# Patient Record
Sex: Female | Born: 1970 | Race: Black or African American | Hispanic: No | Marital: Single | Smoking: Current every day smoker
Health system: Southern US, Community
[De-identification: ages and names within clinical notes are randomized; demographics above are authoritative.]

## PROBLEM LIST (undated history)

## (undated) DIAGNOSIS — I10 Essential (primary) hypertension: Secondary | ICD-10-CM

---

## 2006-07-26 ENCOUNTER — Emergency Department: Payer: Self-pay | Admitting: Emergency Medicine

## 2006-07-26 ENCOUNTER — Other Ambulatory Visit: Payer: Self-pay

## 2006-10-10 ENCOUNTER — Emergency Department: Payer: Self-pay | Admitting: Emergency Medicine

## 2006-12-21 ENCOUNTER — Emergency Department: Payer: Self-pay | Admitting: Internal Medicine

## 2007-04-05 ENCOUNTER — Other Ambulatory Visit: Payer: Self-pay

## 2007-04-05 ENCOUNTER — Emergency Department: Payer: Self-pay | Admitting: Emergency Medicine

## 2007-05-18 ENCOUNTER — Emergency Department: Payer: Self-pay | Admitting: Emergency Medicine

## 2008-09-27 ENCOUNTER — Emergency Department: Payer: Self-pay | Admitting: Emergency Medicine

## 2008-10-22 ENCOUNTER — Emergency Department: Payer: Self-pay | Admitting: Unknown Physician Specialty

## 2008-11-04 ENCOUNTER — Emergency Department: Payer: Self-pay | Admitting: Emergency Medicine

## 2009-09-18 ENCOUNTER — Emergency Department: Payer: Self-pay | Admitting: Emergency Medicine

## 2010-01-11 ENCOUNTER — Emergency Department: Payer: Self-pay | Admitting: Emergency Medicine

## 2010-03-01 ENCOUNTER — Emergency Department: Payer: Self-pay | Admitting: Emergency Medicine

## 2010-07-27 ENCOUNTER — Emergency Department: Payer: Self-pay | Admitting: Emergency Medicine

## 2011-01-16 ENCOUNTER — Emergency Department: Payer: Self-pay | Admitting: Internal Medicine

## 2011-05-31 ENCOUNTER — Emergency Department: Payer: Self-pay | Admitting: Emergency Medicine

## 2011-08-22 ENCOUNTER — Emergency Department: Payer: Self-pay | Admitting: *Deleted

## 2011-12-27 ENCOUNTER — Ambulatory Visit: Payer: Self-pay | Admitting: Family Medicine

## 2012-03-06 ENCOUNTER — Emergency Department: Payer: Self-pay | Admitting: Emergency Medicine

## 2012-03-06 LAB — CBC
HGB: 10.8 g/dL — ABNORMAL LOW (ref 12.0–16.0)
MCH: 23.6 pg — ABNORMAL LOW (ref 26.0–34.0)
MCHC: 31.6 g/dL — ABNORMAL LOW (ref 32.0–36.0)
MCV: 75 fL — ABNORMAL LOW (ref 80–100)
Platelet: 348 10*3/uL (ref 150–440)
RBC: 4.58 10*6/uL (ref 3.80–5.20)
RDW: 17.7 % — ABNORMAL HIGH (ref 11.5–14.5)
WBC: 9.2 10*3/uL (ref 3.6–11.0)

## 2012-03-06 LAB — COMPREHENSIVE METABOLIC PANEL
Albumin: 3.8 g/dL (ref 3.4–5.0)
Anion Gap: 9 (ref 7–16)
Bilirubin,Total: 0.3 mg/dL (ref 0.2–1.0)
Chloride: 105 mmol/L (ref 98–107)
Co2: 25 mmol/L (ref 21–32)
EGFR (African American): >60
SGPT (ALT): 21 U/L (ref 12–78)
Sodium: 139 mmol/L (ref 136–145)
Total Protein: 7.8 g/dL (ref 6.4–8.2)

## 2012-03-06 LAB — URINALYSIS, COMPLETE
Bacteria: NONE SEEN
Nitrite: NEGATIVE
Protein: 30
Specific Gravity: 1.019 (ref 1.003–1.030)
Squamous Epithelial: 5

## 2013-01-09 ENCOUNTER — Emergency Department: Payer: Self-pay | Admitting: Emergency Medicine

## 2013-01-09 LAB — BASIC METABOLIC PANEL
BUN: 11 mg/dL (ref 7–18)
Chloride: 105 mmol/L (ref 98–107)
Creatinine: 0.77 mg/dL (ref 0.60–1.30)
EGFR (Non-African Amer.): >60
Glucose: 116 mg/dL — ABNORMAL HIGH (ref 65–99)
Osmolality: 272 (ref 275–301)
Potassium: 3.7 mmol/L (ref 3.5–5.1)
Sodium: 136 mmol/L (ref 136–145)

## 2013-01-09 LAB — CBC
HCT: 33.5 % — ABNORMAL LOW (ref 35.0–47.0)
MCH: 23.8 pg — ABNORMAL LOW (ref 26.0–34.0)
MCHC: 32.2 g/dL (ref 32.0–36.0)
MCV: 74 fL — ABNORMAL LOW (ref 80–100)
Platelet: 292 10*3/uL (ref 150–440)
RBC: 4.54 10*6/uL (ref 3.80–5.20)
RDW: 17.5 % — ABNORMAL HIGH (ref 11.5–14.5)

## 2013-05-17 ENCOUNTER — Ambulatory Visit: Payer: Self-pay | Admitting: Family Medicine

## 2014-02-18 ENCOUNTER — Emergency Department: Payer: Self-pay | Admitting: Emergency Medicine

## 2014-03-31 ENCOUNTER — Emergency Department: Payer: Self-pay | Admitting: Emergency Medicine

## 2014-07-03 ENCOUNTER — Other Ambulatory Visit: Payer: Self-pay | Admitting: Nurse Practitioner

## 2014-07-03 DIAGNOSIS — Z1239 Encounter for other screening for malignant neoplasm of breast: Secondary | ICD-10-CM

## 2014-07-25 ENCOUNTER — Ambulatory Visit: Payer: Self-pay | Attending: Nurse Practitioner

## 2014-12-09 ENCOUNTER — Ambulatory Visit: Payer: Self-pay | Admitting: Family Medicine

## 2015-03-02 ENCOUNTER — Encounter: Payer: Self-pay | Admitting: *Deleted

## 2015-03-02 ENCOUNTER — Emergency Department
Admission: EM | Admit: 2015-03-02 | Discharge: 2015-03-02 | Disposition: A | Discharge status: Home / Self Care | Payer: BLUE CROSS/BLUE SHIELD | Attending: Emergency Medicine | Admitting: Emergency Medicine

## 2015-03-02 ENCOUNTER — Emergency Department: Payer: BLUE CROSS/BLUE SHIELD

## 2015-03-02 DIAGNOSIS — R531 Weakness: Secondary | ICD-10-CM | POA: Diagnosis not present

## 2015-03-02 DIAGNOSIS — R42 Dizziness and giddiness: Secondary | ICD-10-CM | POA: Diagnosis present

## 2015-03-02 DIAGNOSIS — Z3202 Encounter for pregnancy test, result negative: Secondary | ICD-10-CM | POA: Diagnosis not present

## 2015-03-02 DIAGNOSIS — R5383 Other fatigue: Secondary | ICD-10-CM | POA: Diagnosis not present

## 2015-03-02 LAB — COMPREHENSIVE METABOLIC PANEL
ALBUMIN: 3.9 g/dL (ref 3.5–5.0)
ALK PHOS: 61 U/L (ref 38–126)
ALT: 14 U/L (ref 14–54)
ANION GAP: 7 (ref 5–15)
AST: 19 U/L (ref 15–41)
BILIRUBIN TOTAL: 0.7 mg/dL (ref 0.3–1.2)
BUN: 7 mg/dL (ref 6–20)
CALCIUM: 9 mg/dL (ref 8.9–10.3)
CO2: 25 mmol/L (ref 22–32)
Chloride: 105 mmol/L (ref 101–111)
Creatinine, Ser: 0.54 mg/dL (ref 0.44–1.00)
GFR calc Af Amer: >60 mL/min (ref >60)
GFR calc non Af Amer: >60 mL/min (ref >60)
GLUCOSE: 103 mg/dL — AB (ref 65–99)
POTASSIUM: 3.5 mmol/L (ref 3.5–5.1)
Sodium: 137 mmol/L (ref 135–145)
Total Protein: 7.3 g/dL (ref 6.5–8.1)

## 2015-03-02 LAB — CBC
HEMATOCRIT: 39 % (ref 35.0–47.0)
Hemoglobin: 12.5 g/dL (ref 12.0–16.0)
MCH: 24.9 pg — ABNORMAL LOW (ref 26.0–34.0)
MCHC: 32.2 g/dL (ref 32.0–36.0)
MCV: 77.4 fL — AB (ref 80.0–100.0)
Platelets: 277 10*3/uL (ref 150–440)
RBC: 5.04 MIL/uL (ref 3.80–5.20)
RDW: 17.3 % — ABNORMAL HIGH (ref 11.5–14.5)
WBC: 8.3 10*3/uL (ref 3.6–11.0)

## 2015-03-02 LAB — PREGNANCY, URINE: Preg Test, Ur: NEGATIVE

## 2015-03-02 LAB — TROPONIN I: Troponin I: <0.03 ng/mL (ref <0.031)

## 2015-03-02 MED ORDER — MECLIZINE HCL 25 MG PO TABS: 25.0000 mg | ORAL_TABLET | Freq: Three times a day (TID) | ORAL | Status: DC | PRN

## 2015-03-02 MED ORDER — MECLIZINE HCL 25 MG PO TABS
25.0000 mg | ORAL_TABLET | Freq: Once | ORAL | Status: AC
  Administered 2015-03-02: 25 mg via ORAL
  Filled 2015-03-02: qty 1

## 2015-03-02 MED ORDER — SODIUM CHLORIDE 0.9 % IV BOLUS (SEPSIS)
1000.0000 mL | Freq: Once | INTRAVENOUS | Status: AC
  Administered 2015-03-02: 1000 mL via INTRAVENOUS

## 2015-03-02 NOTE — ED Provider Notes (Signed)
[facility] Emergency Department Provider Note  Time seen: 2:18 PM  I have reviewed the triage vital signs and the nursing notes.   HISTORY  Chief Complaint No chief complaint on file.    HPI Whitney Hart is a 45 y.o. female presents to the emergency department with acute onset of slurred speech, and generalized weakness at 1:15 PM. According to the patient she was at work when a precisely 1:15 PM she became dizzy, states she was having trouble speaking. She is not sure if she was slurring her words her just because of the extreme fatigue became over her that she was not able to speak correctly. She states she attempted to get up and walk but kept falling to her right side. States her symptoms have resolved at this time, feels extremely fatigued, but denies any slurred speech, denies any right sided weakness. Denies any similar symptoms previously. Patient states she has had a mild cough for the past one week, and she describes a sinus infection which is now resolved. Denies any chest pain or headache now or at any time.     No past medical history on file.  There are no active problems to display for this patient.   No past surgical history on file.  No current outpatient prescriptions on file.  Allergies Review of patient's allergies indicates not on file.  No family history on file.  Social History Social History  Substance Use Topics  . Smoking status: Not on file  . Smokeless tobacco: Not on file  . Alcohol Use: Not on file    Review of Systems Constitutional: Negative for fever. Cardiovascular: Negative for chest pain. Respiratory: Negative for shortness of breath. Gastrointestinal: Negative for abdominal pain Neurological: Negative for . Possible right-sided weakness which is now resolved. Denies numbness. 10-point ROS otherwise negative.  ____________________________________________   PHYSICAL EXAM:  VITAL  SIGNS:  Constitutional: Alert and oriented. Well appearing and in no distress. Eyes: Normal exam, [facility]   Head: Normocephalic and atraumatic.   Mouth/Throat: Mucous membranes are moist. Cardiovascular: Normal rate, regular rhythm. No murmur Respiratory: Normal respiratory effort without tachypnea nor retractions. Breath sounds are clear  Gastrointestinal: Soft and nontender. No distention.   Musculoskeletal: Nontender with normal range of motion in all extremities Neurologic:  Normal speech and language. No gross focal neurologic deficits. Equal grip strengths bilaterally. 5/5 motor in all extremities. No pronator drift. No lower extremity drift. Cranial nerves intact. No facial droop. Skin:  Skin is warm, dry and intact.  Psychiatric: Mood and affect are normal. Speech and behavior are normal.   ____________________________________________    EKG  EKG reviewed and interpreted by myself shows normal sinus rhythm at 92 bpm, narrow QRS, normal axis, normal intervals, nonspecific ST changes. No ST elevations.  ____________________________________________    RADIOLOGY  CT shows no acute abnormalities  ____________________________________________   INITIAL IMPRESSION / ASSESSMENT AND PLAN / ED COURSE  Pertinent labs & imaging results that were available during my care of the patient were reviewed by me and considered in my medical decision making (see chart for details).  Patient presents the emergency department after an episode of extreme fatigue, difficulty speaking, and possible right-sided weakness. States her symptoms are largely resolved besides extreme fatigue. Denies any similar symptoms in the past. Symptoms started promptly at 1:15 PM. States her dizziness has resolved. Denies headache now or at any time. We will check labs, CT head, EKG, and consult telemetry neurology. Patient agreeable to  plan.  CT, EKG, labs are largely within normal limits. Patient has been  seen by telemetry neurology, they do not believe the patient's symptoms are consistent with CVA. With a normal workup, they recommend follow-up with her primary care physician. Patient has no symptoms at present time, we will dose meclizine for some mild dizziness, dose IV fluids, and monitor closely in the emergency department but we'll likely discharge home if patient remains asymptomatic.  Telemetry neurology does not believe symptoms are consistent with CVA. Labs are within normal limits. CT negative. The patient appears very well the emergency department, ambulates without difficulty, states all of her symptoms are gone. We'll discharge home with primary care follow-up tomorrow. Patient agreeable plan.  ____________________________________________   FINAL CLINICAL IMPRESSION(S) / ED DIAGNOSES  Dizziness Fatigue   Harvest Dark, MD 03/02/15 1659

## 2015-03-02 NOTE — ED Notes (Signed)
Patient taken to CT scan.

## 2015-03-02 NOTE — Discharge Instructions (Signed)
Dizziness Dizziness is a common problem. It makes you feel unsteady or lightheaded. You may feel like you are about to pass out (faint). Dizziness can lead to injury if you stumble or fall. Anyone can get dizzy, but dizziness is more common in older adults. This condition can be caused by a number of things, including:  Medicines.  Dehydration.  Illness. HOME CARE Following these instructions may help with your condition: Eating and Drinking  Drink enough fluid to keep your pee (urine) clear or pale yellow. This helps to keep you from getting dehydrated. Try to drink more clear fluids, such as water.  Do not drink alcohol.  Limit how much caffeine you drink or eat if told by your doctor.  Limit how much salt you drink or eat if told by your doctor. Activity  Avoid making quick movements.  When you stand up from sitting in a chair, steady yourself until you feel okay.  In the morning, first sit up on the side of the bed. When you feel okay, stand slowly while you hold onto something. Do this until you know that your balance is fine.  Move your legs often if you need to stand in one place for a long time. Tighten and relax your muscles in your legs while you are standing.  Do not drive or use heavy machinery if you feel dizzy.  Avoid bending down if you feel dizzy. Place items in your home so that they are easy for you to reach without leaning over. Lifestyle  Do not use any tobacco products, including cigarettes, chewing tobacco, or electronic cigarettes. If you need help quitting, ask your doctor.  Try to lower your stress level, such as with yoga or meditation. Talk with your doctor if you need help. General Instructions  Watch your dizziness for any changes.  Take medicines only as told by your doctor. Talk with your doctor if you think that your dizziness is caused by a medicine that you are taking.  Tell a friend or a family member that you are feeling dizzy. If he or  she notices any changes in your behavior, have this person call your doctor.  Keep all follow-up visits as told by your doctor. This is important. GET HELP IF:  Your dizziness does not go away.  Your dizziness or light-headedness gets worse.  You feel sick to your stomach (nauseous).  You have trouble hearing.  You have new symptoms.  You are unsteady on your feet or you feel like the room is spinning. GET HELP RIGHT AWAY IF:  You throw up (vomit) or have diarrhea and are unable to eat or drink anything.  You have trouble:  Talking.  Walking.  Swallowing.  Using your arms, hands, or legs.  You feel generally weak.  You are not thinking clearly or you have trouble forming sentences. It may take a friend or family member to notice this.  You have:  Chest pain.  Pain in your belly (abdomen).  Shortness of breath.  Sweating.  Your vision changes.  You are bleeding.  You have a headache.  You have neck pain or a stiff neck.  You have a fever.   This information is not intended to replace advice given to you by your health care provider. Make sure you discuss any questions you have with your health care provider.   Document Released: 01/14/2011 Document Revised: 06/11/2014 Document Reviewed: 01/21/2014 Elsevier Interactive Patient Education 2016 Elsevier Inc.  Weakness Weakness is a  lack of strength. You may feel weak all over your body or just in one part of your body. Weakness can be serious. In some cases, you may need more medical tests. HOME [place] a well-balanced diet.  Try to exercise every day.  Only take medicines as told by your doctor. GET HELP RIGHT AWAY IF:   You cannot do your normal daily activities.  You cannot walk up and down stairs, or you feel very tired when you do so.  You have shortness of breath or chest pain.  You have trouble moving parts of your body.  You have weakness in only one body part or on only one  side of the body.  You have a fever.  You have trouble speaking or swallowing.  You cannot control when you pee (urinate) or poop (bowel movement).  You have black or bloody throw up (vomit) or poop.  Your weakness gets worse or spreads to other body parts.  You have new aches or pains. MAKE SURE YOU:   Understand these instructions.  Will watch your condition.  Will get help right away if you are not doing well or get worse.   This information is not intended to replace advice given to you by your health care provider. Make sure you discuss any questions you have with your health care provider.   Document Released: 01/08/2008 Document Revised: 07/27/2011 Document Reviewed: 03/26/2011 Elsevier Interactive Patient Education [employer].

## 2015-03-02 NOTE — ED Notes (Signed)
Patient states that approximately 1/2 hour after arrival to work, she felt dizzy, weak and slurred speech and blurry vision that occurred for  5 minutes. Patient states she had 2 episodes of vomiting, one last night and one this morning. Patient states she became short of breath and weak with vomiting.

## 2015-08-12 ENCOUNTER — Emergency Department
Admission: EM | Admit: 2015-08-12 | Discharge: 2015-08-13 | Disposition: A | Discharge status: Home / Self Care | Payer: BLUE CROSS/BLUE SHIELD | Attending: Emergency Medicine | Admitting: Emergency Medicine

## 2015-08-12 DIAGNOSIS — F17200 Nicotine dependence, unspecified, uncomplicated: Secondary | ICD-10-CM | POA: Diagnosis not present

## 2015-08-12 DIAGNOSIS — R109 Unspecified abdominal pain: Secondary | ICD-10-CM

## 2015-08-12 DIAGNOSIS — D259 Leiomyoma of uterus, unspecified: Secondary | ICD-10-CM

## 2015-08-12 DIAGNOSIS — M546 Pain in thoracic spine: Secondary | ICD-10-CM | POA: Diagnosis present

## 2015-08-12 DIAGNOSIS — Z79899 Other long term (current) drug therapy: Secondary | ICD-10-CM | POA: Insufficient documentation

## 2015-08-12 DIAGNOSIS — R1013 Epigastric pain: Secondary | ICD-10-CM | POA: Diagnosis not present

## 2015-08-12 NOTE — ED Notes (Signed)
Pt reports she is having pain under left shoulder blade that radiates under left breast (when the pain "hits hard" her head hurts and she becomes short of breath) - Denies any other problems at this time - Pt reports she has been having this issue for 3 days of and on

## 2015-08-12 NOTE — ED Notes (Signed)
Pt in with co mid left back pain that radiates under left breast, denies any injury. States pain worse when she moves and takes a deep breath.

## 2015-08-13 ENCOUNTER — Emergency Department: Payer: BLUE CROSS/BLUE SHIELD

## 2015-08-13 LAB — URINALYSIS COMPLETE WITH MICROSCOPIC (ARMC ONLY)
BACTERIA UA: NONE SEEN
Bilirubin Urine: NEGATIVE
GLUCOSE, UA: NEGATIVE mg/dL
Ketones, ur: NEGATIVE mg/dL
Leukocytes, UA: NEGATIVE
NITRITE: NEGATIVE
Protein, ur: NEGATIVE mg/dL
SPECIFIC GRAVITY, URINE: 1.008 (ref 1.005–1.030)
pH: 6 (ref 5.0–8.0)

## 2015-08-13 MED ORDER — OXYCODONE-ACETAMINOPHEN 5-325 MG PO TABS
ORAL_TABLET | ORAL | Status: AC
  Administered 2015-08-13: 1 via ORAL
  Filled 2015-08-13: qty 1

## 2015-08-13 MED ORDER — OXYCODONE-ACETAMINOPHEN 5-325 MG PO TABS
1.0000 | ORAL_TABLET | Freq: Once | ORAL | Status: AC
  Administered 2015-08-13: 1 via ORAL

## 2015-08-13 MED ORDER — CYCLOBENZAPRINE HCL 10 MG PO TABS: 10.0000 mg | ORAL_TABLET | Freq: Three times a day (TID) | ORAL | Status: DC | PRN

## 2015-08-13 NOTE — Discharge Instructions (Signed)
Abdominal Pain, Adult Many things can cause abdominal pain. Usually, abdominal pain is not caused by a disease and will improve without treatment. It can often be observed and treated at home. Your health care provider will do a physical exam and possibly order blood tests and X-rays to help determine the seriousness of your pain. However, in many cases, more time must pass before a clear cause of the pain can be found. Before that point, your health care provider may not know if you need more testing or further treatment. HOME CARE INSTRUCTIONS Monitor your abdominal pain for any changes. The following actions may help to alleviate any discomfort you are experiencing:  Only take over-the-counter or prescription medicines as directed by your health care provider.  Do not take laxatives unless directed to do so by your health care provider.  Try a clear liquid diet (broth, tea, or water) as directed by your health care provider. Slowly move to a bland diet as tolerated. SEEK MEDICAL CARE IF:  You have unexplained abdominal pain.  You have abdominal pain associated with nausea or diarrhea.  You have pain when you urinate or have a bowel movement.  You experience abdominal pain that wakes you in the night.  You have abdominal pain that is worsened or improved by eating food.  You have abdominal pain that is worsened with eating fatty foods.  You have a fever. SEEK IMMEDIATE MEDICAL CARE IF:  Your pain does not go away within 2 hours.  You keep throwing up (vomiting).  Your pain is felt only in portions of the abdomen, such as the right side or the left lower portion of the abdomen.  You pass bloody or black tarry stools. MAKE SURE YOU:  Understand these instructions.  Will watch your condition.  Will get help right away if you are not doing well or get worse.   This information is not intended to replace advice given to you by your health care provider. Make sure you discuss  any questions you have with your health care provider.   Document Released: 11/04/2004 Document Revised: 10/16/2014 Document Reviewed: 10/04/2012 Elsevier Interactive Patient Education 2016 Elsevier Inc.  Uterine Fibroids Uterine fibroids are tissue masses (tumors) that can develop in the womb (uterus). They are also called leiomyomas. This type of tumor is not cancerous (benign) and does not spread to other parts of the body outside of the pelvic area, which is between the hip bones. Occasionally, fibroids may develop in the fallopian tubes, in the cervix, or on the support structures (ligaments) that surround the uterus. You can have one or many fibroids. Fibroids can vary in size, weight, and where they grow in the uterus. Some can become quite large. Most fibroids do not require medical treatment. CAUSES A fibroid can develop when a single uterine cell keeps growing (replicating). Most cells in the human body have a control mechanism that keeps them from replicating without control. SIGNS AND SYMPTOMS Symptoms may include:   Heavy bleeding during your period.  Bleeding or spotting between periods.  Pelvic pain and pressure.  Bladder problems, such as needing to urinate more often (urinary frequency) or urgently.  Inability to reproduce offspring (infertility).  Miscarriages. DIAGNOSIS Uterine fibroids are diagnosed through a physical exam. Your health care provider may feel the lumpy tumors during a pelvic exam. Ultrasonography and an MRI may be done to determine the size, location, and number of fibroids. TREATMENT Treatment may include:  Watchful waiting. This involves getting the fibroid  checked by your health care provider to see if it grows or shrinks. Follow your health care provider's recommendations for how often to have this checked.  Hormone medicines. These can be taken by mouth or given through an intrauterine device (IUD).  Surgery.  Removing the fibroids  (myomectomy) or the uterus (hysterectomy).  Removing blood supply to the fibroids (uterine artery embolization). If fibroids interfere with your fertility and you want to become pregnant, your health care provider may recommend having the fibroids removed.  HOME CARE INSTRUCTIONS  Keep all follow-up visits as directed by your health care provider. This is important.  Take medicines only as directed by your health care provider.  If you were prescribed a hormone treatment, take the hormone medicines exactly as directed.  Do not take aspirin, because it can cause bleeding.  Ask your health care provider about taking iron pills and increasing the amount of dark green, leafy vegetables in your diet. These actions can help to boost your blood iron levels, which may be affected by heavy menstrual bleeding.  Pay close attention to your period and tell your health care provider about any changes, such as:  Increased blood flow that requires you to use more pads or tampons than usual per month.  A change in the number of days that your period lasts per month.  A change in symptoms that are associated with your period, such as abdominal cramping or back pain. SEEK MEDICAL CARE IF:  You have pelvic pain, back pain, or abdominal cramps that cannot be controlled with medicines.  You have an increase in bleeding between and during periods.  You soak tampons or pads in a half hour or less.  You feel lightheaded, extra tired, or weak. SEEK IMMEDIATE MEDICAL CARE IF:  You faint.  You have a sudden increase in pelvic pain.   This information is not intended to replace advice given to you by your health care provider. Make sure you discuss any questions you have with your health care provider.   Document Released: 01/23/2000 Document Revised: 02/15/2014 Document Reviewed: 07/24/2013 Elsevier Interactive Patient Education [employer].

## 2015-08-15 NOTE — ED Provider Notes (Signed)
[facility] Emergency Department Provider Note  ____________________________________________  Time seen: 12:10 AM  I have reviewed the triage vital signs and the nursing notes.   HISTORY  Chief Complaint Back Pain     HPI Whitney Hart is a 45 y.o. female presents with left upper back pain with radiation under (3 days intermittently. Patient states the pain is worse with movement and deep inspiration. Patient denies any dysuria or hematuria. Patient denies any increased urinary frequency or urgency.      Past medical history None There are no active problems to display for this patient.   Past surgical history None  Current Outpatient Rx  Name  Route  Sig  Dispense  Refill  . cyclobenzaprine (FLEXERIL) 10 MG tablet   Oral   Take 1 tablet (10 mg total) by mouth 3 (three) times daily as needed for muscle spasms.   30 tablet   0   . lisinopril (PRINIVIL,ZESTRIL) 10 MG tablet   Oral   Take 20 mg by mouth daily.      0   . meclizine (ANTIVERT) 25 MG tablet   Oral   Take 1 tablet (25 mg total) by mouth 3 (three) times daily as needed for dizziness.   30 tablet   0   . Potassium Chloride ER 20 MEQ TBCR   Oral   Take 20 mEq by mouth daily.      0   . PROAIR HFA 108 (90 Base) MCG/ACT inhaler   Inhalation   Inhale 2 puffs into the lungs every 4 (four) hours as needed.      0     Dispense as written.     Allergies No known drug allergies No family history on file.  Social History Social History  Substance Use Topics  . Smoking status: Current Every Day Smoker  . Smokeless tobacco: Not on file  . Alcohol Use: Yes     Comment: occasionally    Review of Systems  Constitutional: Negative for fever. Eyes: Negative for visual changes. ENT: Negative for sore throat. Cardiovascular: Negative for chest pain. Respiratory: Negative for shortness of breath. Gastrointestinal: Negative for abdominal pain, vomiting and diarrhea.  Positive for left flank pain Genitourinary: Negative for dysuria. Musculoskeletal: Positive for back pain. Skin: Negative for rash. Neurological: Negative for headaches, focal weakness or numbness.   10-point ROS otherwise negative.  ____________________________________________   PHYSICAL EXAM:  VITAL SIGNS: ED Triage Vitals  Enc Vitals Group     BP 08/12/15 2300 125/76 mmHg     Pulse Rate 08/12/15 2300 95     Resp 08/12/15 2300 18     Temp 08/12/15 2300 98.2 F (36.8 C)     Temp Source 08/12/15 2300 Oral     SpO2 08/12/15 2300 96 %     Weight 08/12/15 2259 225 lb (102.059 kg)     Height 08/12/15 2259 5\' 6"  (1.676 m)     Head Cir --      Peak Flow --      Pain Score 08/12/15 2259 8     Pain Loc --      Pain Edu? --      Excl. in [place]? --      Constitutional: Alert and oriented. Well appearing and in no distress. Eyes: Conjunctivae are normal. PERRL. Normal extraocular movements. ENT   Head: Normocephalic and atraumatic.   Nose: No congestion/rhinnorhea.   Mouth/Throat: Mucous membranes are moist.   Neck: No stridor. Hematological/Lymphatic/Immunilogical: No  cervical lymphadenopathy. Cardiovascular: Normal rate, regular rhythm. Normal and symmetric distal pulses are present in all extremities. No murmurs, rubs, or gallops. Respiratory: Normal respiratory effort without tachypnea nor retractions. Breath sounds are clear and equal bilaterally. No wheezes/rales/rhonchi. Gastrointestinal: Pain with palpation epigastric left upper quadrant.. No distention. There is no CVA tenderness. Genitourinary: deferred Musculoskeletal: Nontender with normal range of motion in all extremities. No joint effusions.  No lower extremity tenderness nor edema. Neurologic:  Normal speech and language. No gross focal neurologic deficits are appreciated. Speech is normal.  Skin:  Skin is warm, dry and intact. No rash noted. Psychiatric: Mood and affect are normal. Speech and behavior  are normal. Patient exhibits appropriate insight and judgment.  ____________________________________________    LABS (pertinent positives/negatives)  Labs Reviewed  URINALYSIS COMPLETEWITH MICROSCOPIC ([place] ONLY) - Abnormal; Notable for the following:    Color, Urine STRAW (*)    APPearance CLEAR (*)    Hgb urine dipstick 2+ (*)    Squamous Epithelial / LPF 0-5 (*)    All other components within normal limits     ____________________________________________   EKG  ED ECG REPORT I, [place] N BROWN, the attending physician, personally viewed and interpreted this ECG.   Date: 08/15/2015  EKG Time: 11:28 PM  Rate: 87  Rhythm: Normal sinus rhythm  Axis: Normal  Intervals: Normal  ST&T Change: None   ____________________________________________    RADIOLOGY CT Renal Stone Study (Final result) Result time: 08/13/15 02:24:04   Final result by Rad Results In Interface (08/13/15 02:24:04)   Narrative:   CLINICAL DATA: Left flank pain. Intermittent pain for 3 days.  EXAM: CT ABDOMEN AND PELVIS WITHOUT CONTRAST  TECHNIQUE: Multidetector CT imaging of the abdomen and pelvis was performed following the standard protocol without IV contrast.  COMPARISON: Pelvic ultrasound 03/03/2012  FINDINGS: Lower chest: Bilateral lower lobe atelectasis. No pleural fluid.  Liver: No focal lesion allowing for lack contrast.  Hepatobiliary: Gallbladder partially distended, no calcified stone. No biliary dilatation.  Pancreas: No ductal dilatation or inflammation.  Spleen: Normal.  Adrenal glands: No nodule.  Kidneys: Symmetric in size without stones or hydronephrosis. There is no perinephric stranding. Both ureters are decompressed without stones along the course.  Stomach/Bowel: Stomach physiologically distended. There are no dilated or thickened small bowel loops. Small volume of stool throughout the colon without colonic wall thickening. The appendix is  normal.  Vascular/Lymphatic: No retroperitoneal adenopathy. Abdominal aorta is normal in caliber. Mild atherosclerosis of the abdominal aorta and its branches. No aneurysm.  Reproductive: Uterus is bulbous consistent with fibroids. Low-density structures in both adnexa, may reflect hydrosalpinx. No surrounding inflammation.  Bladder: Physiologically distended, no wall thickening. No bladder stone.  Other: No free air, free fluid, or intra-abdominal fluid collection.  Musculoskeletal: There are no acute or suspicious osseous abnormalities. Scattered degenerative change in the spine.  IMPRESSION: 1. No renal stones or obstructive uropathy. 2. Low-density structures in both adnexa, may reflect hydrosalpinx. In the absence of symptoms referable to the pelvis this is likely incidental. 3. Mild aortic atherosclerosis.   Electronically Signed By: Jeb Levering M.D. On: 08/13/2015 02:24       Procedures      INITIAL IMPRESSION / ASSESSMENT AND PLAN / ED COURSE  Pertinent labs & imaging results that were available during my care of the patient were reviewed by me and considered in my medical decision making (see chart for details).    ____________________________________________   FINAL CLINICAL IMPRESSION(S) / ED DIAGNOSES  Final diagnoses:  Flank pain  Uterine leiomyoma, unspecified location      Gregor Hams, MD 08/15/15 [phone]

## 2016-02-10 ENCOUNTER — Other Ambulatory Visit: Payer: Self-pay | Admitting: Family Medicine

## 2016-02-10 DIAGNOSIS — N6452 Nipple discharge: Secondary | ICD-10-CM

## 2016-02-10 DIAGNOSIS — N6459 Other signs and symptoms in breast: Secondary | ICD-10-CM

## 2016-02-23 ENCOUNTER — Encounter (HOSPITAL_COMMUNITY): Payer: Self-pay

## 2016-02-23 ENCOUNTER — Ambulatory Visit
Admission: RE | Admit: 2016-02-23 | Discharge: 2016-02-23 | Disposition: A | Discharge status: Home / Self Care | Payer: BLUE CROSS/BLUE SHIELD | Source: Ambulatory Visit | Attending: Family Medicine | Admitting: Family Medicine

## 2016-02-23 DIAGNOSIS — N6452 Nipple discharge: Secondary | ICD-10-CM

## 2016-02-23 DIAGNOSIS — N6459 Other signs and symptoms in breast: Secondary | ICD-10-CM

## 2016-05-13 ENCOUNTER — Emergency Department
Admission: EM | Admit: 2016-05-13 | Discharge: 2016-05-13 | Disposition: A | Discharge status: Home / Self Care | Payer: BLUE CROSS/BLUE SHIELD | Attending: Emergency Medicine | Admitting: Emergency Medicine

## 2016-05-13 ENCOUNTER — Emergency Department: Payer: BLUE CROSS/BLUE SHIELD

## 2016-05-13 ENCOUNTER — Encounter: Payer: Self-pay | Admitting: Emergency Medicine

## 2016-05-13 DIAGNOSIS — Z79899 Other long term (current) drug therapy: Secondary | ICD-10-CM | POA: Diagnosis not present

## 2016-05-13 DIAGNOSIS — R079 Chest pain, unspecified: Secondary | ICD-10-CM | POA: Insufficient documentation

## 2016-05-13 DIAGNOSIS — I10 Essential (primary) hypertension: Secondary | ICD-10-CM | POA: Insufficient documentation

## 2016-05-13 DIAGNOSIS — F17210 Nicotine dependence, cigarettes, uncomplicated: Secondary | ICD-10-CM | POA: Diagnosis not present

## 2016-05-13 HISTORY — DX: Essential (primary) hypertension: I10

## 2016-05-13 LAB — BASIC METABOLIC PANEL
Anion gap: 7 (ref 5–15)
BUN: 14 mg/dL (ref 6–20)
CHLORIDE: 103 mmol/L (ref 101–111)
CO2: 26 mmol/L (ref 22–32)
Calcium: 9.2 mg/dL (ref 8.9–10.3)
Creatinine, Ser: 0.78 mg/dL (ref 0.44–1.00)
GFR calc Af Amer: >60 mL/min (ref >60)
Glucose, Bld: 95 mg/dL (ref 65–99)
POTASSIUM: 3.8 mmol/L (ref 3.5–5.1)
Sodium: 136 mmol/L (ref 135–145)

## 2016-05-13 LAB — FIBRIN DERIVATIVES D-DIMER (ARMC ONLY): Fibrin derivatives D-dimer (ARMC): 300.35 (ref 0.00–499.00)

## 2016-05-13 LAB — CBC
HEMATOCRIT: 42.8 % (ref 35.0–47.0)
Hemoglobin: 14 g/dL (ref 12.0–16.0)
MCH: 27.3 pg (ref 26.0–34.0)
MCHC: 32.7 g/dL (ref 32.0–36.0)
MCV: 83.5 fL (ref 80.0–100.0)
Platelets: 259 10*3/uL (ref 150–440)
RBC: 5.12 MIL/uL (ref 3.80–5.20)
RDW: 15.9 % — AB (ref 11.5–14.5)
WBC: 9.9 10*3/uL (ref 3.6–11.0)

## 2016-05-13 LAB — TROPONIN I: Troponin I: <0.03 ng/mL (ref <0.03)

## 2016-05-13 MED ORDER — IBUPROFEN 800 MG PO TABS: 800.0000 mg | ORAL_TABLET | Freq: Three times a day (TID) | ORAL | 0 refills | Status: DC | PRN

## 2016-05-13 MED ORDER — OXYCODONE-ACETAMINOPHEN 5-325 MG PO TABS
2.0000 | ORAL_TABLET | Freq: Once | ORAL | Status: AC
  Administered 2016-05-13: 2 via ORAL
  Filled 2016-05-13: qty 2

## 2016-05-13 MED ORDER — DIAZEPAM 5 MG PO TABS: 5.0000 mg | ORAL_TABLET | Freq: Three times a day (TID) | ORAL | 0 refills | Status: DC | PRN

## 2016-05-13 NOTE — ED Triage Notes (Signed)
Patient presents to ED via POV with c/o CP x 3 days. Patient states, "It feels like someone is kicking me". Denies any cardiac history. Patient also c/o SOB. Able to speak full, complete sentences.

## 2016-05-13 NOTE — ED Provider Notes (Signed)
[facility] Emergency Department Provider Note       Time seen: ----------------------------------------- 6:22 PM on 05/13/2016 -----------------------------------------     I have reviewed the triage vital signs and the nursing notes.   HISTORY   Chief Complaint Chest Pain    HPI Whitney Hart is a 46 y.o. female who presents to the ED for chest pain for the last 3 days. Patient states it feels like someone is kicking her. Patient states started in the right side of the chest and notes made it more to the central part of the chest. She also complains of shortness of breath. Pain is 8 out of 10, nothing makes it better or worse.   Past Medical History:  Diagnosis Date  . Hypertension     There are no active problems to display for this patient.   History reviewed. No pertinent surgical history.  Allergies Patient has no known allergies.  Social History Social History  Substance Use Topics  . Smoking status: Current Every Day Smoker    Packs/day: 0.50    Types: Cigarettes  . Smokeless tobacco: Never Used  . Alcohol use Yes     Comment: occasionally    Review of Systems Constitutional: Negative for fever. Cardiovascular: Positive for chest pain Respiratory: Positive for shortness of breath Gastrointestinal: Negative for abdominal pain, vomiting and diarrhea. Genitourinary: Negative for dysuria. Musculoskeletal: Negative for back pain. Skin: Negative for rash. Neurological: Negative for headaches, focal weakness or numbness.  10-point ROS otherwise negative.  ____________________________________________   PHYSICAL EXAM:  VITAL SIGNS: ED Triage Vitals  Enc Vitals Group     BP 05/13/16 1702 (!) 141/95     Pulse Rate 05/13/16 1702 70     Resp 05/13/16 1702 16     Temp 05/13/16 1702 98.3 F (36.8 C)     Temp Source 05/13/16 1702 Oral     SpO2 05/13/16 1702 99 %     Weight 05/13/16 1659 217 lb (98.4 kg)     Height  05/13/16 1659 5\' 6"  (1.676 m)     Head Circumference --      Peak Flow --      Pain Score 05/13/16 1659 10     Pain Loc --      Pain Edu? --      Excl. in [place]? --     Constitutional: Alert and oriented. Well appearing and in no distress. Eyes: Conjunctivae are normal. PERRL. Normal extraocular movements. ENT   Head: Normocephalic and atraumatic.   Nose: No congestion/rhinnorhea.   Mouth/Throat: Mucous membranes are moist.   Neck: No stridor. Cardiovascular: Normal rate, regular rhythm. No murmurs, rubs, or gallops. Respiratory: Normal respiratory effort without tachypnea nor retractions. Breath sounds are clear and equal bilaterally. No wheezes/rales/rhonchi. Gastrointestinal: Soft and nontender. Normal bowel sounds Musculoskeletal: Nontender with normal range of motion in extremities. No lower extremity tenderness nor edema. Neurologic:  Normal speech and language. No gross focal neurologic deficits are appreciated.  Skin:  Skin is warm, dry and intact. No rash noted. Psychiatric: Mood and affect are normal. Speech and behavior are normal.  ____________________________________________  EKG: Interpreted by me. Sinus rhythm rate of 67 bpm, normal PR interval, normal QRS, normal QT.  ____________________________________________  ED COURSE:  Pertinent labs & imaging results that were available during my care of the patient were reviewed by me and considered in my medical decision making (see chart for details). Patient presents for chest pain, we will assess with labs and  imaging as indicated.   Procedures ____________________________________________   LABS (pertinent positives/negatives)  Labs Reviewed  CBC - Abnormal; Notable for the following:       Result Value   RDW 15.9 (*)    All other components within normal limits  BASIC METABOLIC PANEL  TROPONIN I  FIBRIN DERIVATIVES D-DIMER ([facility] ONLY)    RADIOLOGY  Chest x-ray is  normal  ____________________________________________  FINAL ASSESSMENT AND PLAN  Chest pain  Plan: Patient's labs and imaging were dictated above. Patient had presented for Chest pain which appears to be noncardiac. She is low risk for ACS. She'll be discharged with Motrin and Valium and encouraged to have close follow-up with her doctor.   Earleen Newport, MD   Note: This note was generated in part or whole with voice recognition software. Voice recognition is usually quite accurate but there are transcription errors that can and very often do occur. I apologize for any typographical errors that were not detected and corrected.     Earleen Newport, MD 05/13/16 [phone]

## 2016-06-26 ENCOUNTER — Observation Stay: Payer: BLUE CROSS/BLUE SHIELD

## 2016-06-26 ENCOUNTER — Observation Stay
Admission: EM | Admit: 2016-06-26 | Discharge: 2016-06-27 | Disposition: A | Discharge status: Home / Self Care | Payer: BLUE CROSS/BLUE SHIELD | Attending: Internal Medicine | Admitting: Internal Medicine

## 2016-06-26 ENCOUNTER — Encounter: Payer: Self-pay | Admitting: Emergency Medicine

## 2016-06-26 ENCOUNTER — Emergency Department: Payer: BLUE CROSS/BLUE SHIELD

## 2016-06-26 DIAGNOSIS — G459 Transient cerebral ischemic attack, unspecified: Principal | ICD-10-CM | POA: Insufficient documentation

## 2016-06-26 DIAGNOSIS — Z79899 Other long term (current) drug therapy: Secondary | ICD-10-CM | POA: Insufficient documentation

## 2016-06-26 DIAGNOSIS — H538 Other visual disturbances: Secondary | ICD-10-CM | POA: Insufficient documentation

## 2016-06-26 DIAGNOSIS — Z6834 Body mass index (BMI) 34.0-34.9, adult: Secondary | ICD-10-CM | POA: Insufficient documentation

## 2016-06-26 DIAGNOSIS — E669 Obesity, unspecified: Secondary | ICD-10-CM | POA: Insufficient documentation

## 2016-06-26 DIAGNOSIS — R531 Weakness: Secondary | ICD-10-CM | POA: Diagnosis present

## 2016-06-26 DIAGNOSIS — F17210 Nicotine dependence, cigarettes, uncomplicated: Secondary | ICD-10-CM | POA: Diagnosis not present

## 2016-06-26 DIAGNOSIS — I10 Essential (primary) hypertension: Secondary | ICD-10-CM | POA: Diagnosis not present

## 2016-06-26 DIAGNOSIS — R200 Anesthesia of skin: Secondary | ICD-10-CM | POA: Insufficient documentation

## 2016-06-26 LAB — CBC
HEMATOCRIT: 40 % (ref 35.0–47.0)
HEMOGLOBIN: 13.5 g/dL (ref 12.0–16.0)
MCH: 27.3 pg (ref 26.0–34.0)
MCHC: 33.7 g/dL (ref 32.0–36.0)
MCV: 80.8 fL (ref 80.0–100.0)
Platelets: 248 10*3/uL (ref 150–440)
RBC: 4.95 MIL/uL (ref 3.80–5.20)
RDW: 14.8 % — ABNORMAL HIGH (ref 11.5–14.5)
WBC: 9.7 10*3/uL (ref 3.6–11.0)

## 2016-06-26 LAB — DIFFERENTIAL
BASOS ABS: 0.1 10*3/uL (ref 0–0.1)
Basophils Relative: 2 %
Eosinophils Absolute: 0.2 10*3/uL (ref 0–0.7)
Eosinophils Relative: 2 %
LYMPHS ABS: 3.7 10*3/uL — AB (ref 1.0–3.6)
LYMPHS PCT: 38 %
MONOS PCT: 12 %
Monocytes Absolute: 1.2 10*3/uL — ABNORMAL HIGH (ref 0.2–0.9)
NEUTROS ABS: 4.5 10*3/uL (ref 1.4–6.5)
Neutrophils Relative %: 46 %

## 2016-06-26 LAB — COMPREHENSIVE METABOLIC PANEL
ALBUMIN: 3.9 g/dL (ref 3.5–5.0)
ALT: 23 U/L (ref 14–54)
AST: 34 U/L (ref 15–41)
Alkaline Phosphatase: 61 U/L (ref 38–126)
Anion gap: 10 (ref 5–15)
BUN: 16 mg/dL (ref 6–20)
CHLORIDE: 99 mmol/L — AB (ref 101–111)
CO2: 26 mmol/L (ref 22–32)
Calcium: 9.2 mg/dL (ref 8.9–10.3)
Creatinine, Ser: 0.74 mg/dL (ref 0.44–1.00)
GFR calc non Af Amer: >60 mL/min (ref >60)
Glucose, Bld: 103 mg/dL — ABNORMAL HIGH (ref 65–99)
Potassium: 3.6 mmol/L (ref 3.5–5.1)
SODIUM: 135 mmol/L (ref 135–145)
Total Bilirubin: 0.7 mg/dL (ref 0.3–1.2)
Total Protein: 7.4 g/dL (ref 6.5–8.1)

## 2016-06-26 LAB — PROTIME-INR
INR: 0.93
Prothrombin Time: 12.5 seconds (ref 11.4–15.2)

## 2016-06-26 LAB — APTT: APTT: 29 s (ref 24–36)

## 2016-06-26 LAB — GLUCOSE, CAPILLARY: Glucose-Capillary: 90 mg/dL (ref 65–99)

## 2016-06-26 LAB — TROPONIN I: Troponin I: <0.03 ng/mL (ref <0.03)

## 2016-06-26 MED ORDER — METOPROLOL SUCCINATE ER 50 MG PO TB24: 100.0000 mg | ORAL_TABLET | Freq: Every day | ORAL | Status: DC

## 2016-06-26 MED ORDER — ASPIRIN EC 325 MG PO TBEC: 325.0000 mg | DELAYED_RELEASE_TABLET | Freq: Every day | ORAL | Status: DC

## 2016-06-26 MED ORDER — ALBUTEROL SULFATE (2.5 MG/3ML) 0.083% IN NEBU: 3.0000 mL | INHALATION_SOLUTION | RESPIRATORY_TRACT | Status: DC | PRN

## 2016-06-26 MED ORDER — HYDROCHLOROTHIAZIDE 25 MG PO TABS: 25.0000 mg | ORAL_TABLET | Freq: Every day | ORAL | Status: DC

## 2016-06-26 MED ORDER — STROKE: EARLY STAGES OF RECOVERY BOOK
Freq: Once | Status: AC
  Administered 2016-06-26: 18:00:00

## 2016-06-26 MED ORDER — AMOXICILLIN 250 MG PO CAPS
750.0000 mg | ORAL_CAPSULE | Freq: Once | ORAL | Status: AC
  Administered 2016-06-26: 18:00:00 750 mg via ORAL
  Filled 2016-06-26: qty 3

## 2016-06-26 MED ORDER — AMLODIPINE BESYLATE 10 MG PO TABS: 10.0000 mg | ORAL_TABLET | Freq: Every day | ORAL | Status: DC

## 2016-06-26 MED ORDER — ACETAMINOPHEN 650 MG RE SUPP: 650.0000 mg | RECTAL | Status: DC | PRN

## 2016-06-26 MED ORDER — VITAMIN D (ERGOCALCIFEROL) 1.25 MG (50000 UNIT) PO CAPS: 50000.0000 [IU] | ORAL_CAPSULE | ORAL | Status: DC

## 2016-06-26 MED ORDER — SPIRONOLACTONE 25 MG PO TABS: 25.0000 mg | ORAL_TABLET | Freq: Every day | ORAL | Status: DC

## 2016-06-26 MED ORDER — TRAMADOL HCL 50 MG PO TABS: 50.0000 mg | ORAL_TABLET | Freq: Two times a day (BID) | ORAL | Status: DC | PRN

## 2016-06-26 MED ORDER — ASPIRIN 300 MG RE SUPP: 300.0000 mg | Freq: Every day | RECTAL | Status: DC

## 2016-06-26 MED ORDER — ASPIRIN 81 MG PO CHEW
324.0000 mg | CHEWABLE_TABLET | Freq: Once | ORAL | Status: AC
  Administered 2016-06-26: 324 mg via ORAL
  Filled 2016-06-26: qty 4

## 2016-06-26 MED ORDER — ASPIRIN 81 MG PO CHEW
162.0000 mg | CHEWABLE_TABLET | Freq: Once | ORAL | Status: AC
  Administered 2016-06-26: 162 mg via ORAL
  Filled 2016-06-26: qty 2

## 2016-06-26 MED ORDER — ACETAMINOPHEN 325 MG PO TABS: 650.0000 mg | ORAL_TABLET | ORAL | Status: DC | PRN

## 2016-06-26 MED ORDER — ACETAMINOPHEN 160 MG/5ML PO SOLN
650.0000 mg | ORAL | Status: DC | PRN
Start: 2016-06-26 — End: 2016-06-27

## 2016-06-26 MED ORDER — NICOTINE 14 MG/24HR TD PT24
14.0000 mg | MEDICATED_PATCH | Freq: Every day | TRANSDERMAL | Status: DC
  Filled 2016-06-26: qty 1

## 2016-06-26 MED ORDER — ENOXAPARIN SODIUM 40 MG/0.4ML ~~LOC~~ SOLN
40.0000 mg | SUBCUTANEOUS | Status: DC
  Administered 2016-06-26: 21:00:00 40 mg via SUBCUTANEOUS
  Filled 2016-06-26: qty 0.4

## 2016-06-26 NOTE — ED Provider Notes (Addendum)
[facility] Emergency Department Provider Note   ____________________________________________   First MD Initiated Contact with Patient 06/26/16 1334     (approximate)  I have reviewed the triage vital signs and the nursing notes.   HISTORY  Chief Complaint Code Stroke   HPI Whitney Hart is a 46 y.o. female  reports she was at work when her left arm and leg got numb and weak. She had trouble lifting the arm and noted legs and the arm or both numb and she could hardly feel them. Symptoms started at 1250 today. That's just a little less than an hour ago. Patient reports now she is not weak at all anymore and the numbness is confined to the left shoulder and the anterior shin of the leg. Things got much better. She's not had this before. She has a history of hypertension and takes to diuretics.   Past Medical History:  Diagnosis Date  . Hypertension     There are no active problems to display for this patient.   History reviewed. No pertinent surgical history.  Prior to Admission medications   Medication Sig Start Date End Date Taking? Authorizing Provider  amLODipine (NORVASC) 10 MG tablet Take 10 mg by mouth daily. 04/18/14  Yes [provider]  cyclobenzaprine (FLEXERIL) 10 MG tablet Take 1 tablet (10 mg total) by mouth 3 (three) times daily as needed for muscle spasms. Patient not taking: Reported on 06/26/2016 08/13/15   Gregor Hams, MD  diazepam (VALIUM) 5 MG tablet Take 1 tablet (5 mg total) by mouth every 8 (eight) hours as needed for muscle spasms. 05/13/16   Earleen Newport, MD  hydrochlorothiazide (HYDRODIURIL) 25 MG tablet Take 25 mg by mouth daily. 05/26/16   [provider]  ibuprofen (ADVIL,MOTRIN) 800 MG tablet Take 1 tablet (800 mg total) by mouth every 8 (eight) hours as needed. Patient not taking: Reported on 06/26/2016 05/13/16   Earleen Newport, MD  meclizine (ANTIVERT) 25 MG tablet Take 1 tablet (25 mg  total) by mouth 3 (three) times daily as needed for dizziness. 03/02/15   Harvest Dark, MD  metoprolol succinate (TOPROL-XL) 100 MG 24 hr tablet Take 100 mg by mouth daily. 06/02/16   [provider]  PROAIR HFA 108 (90 Base) MCG/ACT inhaler Inhale 2 puffs into the lungs every 4 (four) hours as needed. 06/18/15   [provider]  spironolactone (ALDACTONE) 25 MG tablet Take 25 mg by mouth daily. 05/26/16   [provider]  traMADol (ULTRAM) 50 MG tablet Take 50 mg by mouth 2 (two) times daily as needed. 06/02/16   [provider]  triamcinolone cream (KENALOG) 0.5 % Apply 1 application topically daily as needed. 06/11/16   [provider]    Allergies Patient has no known allergies.  Family History  Problem Relation Age of Onset  . Breast cancer Neg Hx     Social History Social History  Substance Use Topics  . Smoking status: Current Every Day Smoker    Packs/day: 0.50    Types: Cigarettes  . Smokeless tobacco: Never Used  . Alcohol use Yes     Comment: occasionally    Review of Systems  Constitutional: No fever/chills Eyes: No visual changes. ENT: No sore throat. Cardiovascular: Denies chest pain. Respiratory: Denies shortness of breath. Gastrointestinal: No abdominal pain.  No nausea, no vomiting.  No diarrhea.  No constipation. Genitourinary: Negative for dysuria. Musculoskeletal: Negative for back pain. Skin: Negative for rash.  Neurological: Negative for headaches   ____________________________________________   PHYSICAL EXAM:  VITAL SIGNS: ED Triage Vitals [06/26/16 1334]  Enc Vitals Group     BP      Pulse      Resp      Temp 97.5 F (36.4 C)     Temp Source Oral     SpO2      Weight      Height      Head Circumference      Peak Flow      Pain Score      Pain Loc      Pain Edu?      Excl. in [place]?     Constitutional: Alert and oriented. Well appearing and in no acute distress. Eyes: Conjunctivae are  normal. PERRL. EOMI. Head: Atraumatic. Nose: No congestion/rhinnorhea. Mouth/Throat: Mucous membranes are moist.  Oropharynx non-erythematous. Neck: No stridor.   Cardiovascular: Normal rate, regular rhythm. Grossly normal heart sounds.  Good peripheral circulation. Respiratory: Normal respiratory effort.  No retractions. Lungs CTAB. Gastrointestinal: Soft and nontender. No distention. No abdominal bruits. No CVA tenderness. Musculoskeletal: No lower extremity tenderness nor edema.  No joint effusions. Neurologic:  Normal speech and language. Cranial nerves II through XII are intact including visual fields rapid alternating movements in the hands and finger to nose is normal motor strength is 5 over 5 throughout. Patient reports some numbness remaining in the left deltoid in the left lower leg. Skin:  Skin is warm, dry and intact. No rash noted. Psychiatric: Mood and affect are normal. Speech and behavior are normal.  ____________________________________________   LABS (all labs ordered are listed, but only abnormal results are displayed)  Labs Reviewed  CBC - Abnormal; Notable for the following:       Result Value   RDW 14.8 (*)    All other components within normal limits  DIFFERENTIAL - Abnormal; Notable for the following:    Lymphs Abs 3.7 (*)    Monocytes Absolute 1.2 (*)    All other components within normal limits  PROTIME-INR  APTT  GLUCOSE, CAPILLARY  COMPREHENSIVE METABOLIC PANEL  TROPONIN I  CBG MONITORING, ED   ____________________________________________  EKG  EKG read and interpreted by me shows normal sinus rhythm rate of 64 normal axis no acute ST-T wave changes ____________________________________________  RADIOLOGY  Addendum   ADDENDUM REPORT: 06/26/2016 13:52  ADDENDUM: Critical Value/emergent results were called by telephone at the time of interpretation on 06/26/2016 at 1:52 pm to Dr. Wells Guiles LORD , who verbally acknowledged these  results.   Electronically Signed   By: Elon Alas M.D.   On: 06/26/2016 13:52   Addended by Elon Alas, MD on 06/26/2016 1:54 PM    Study Result   CLINICAL DATA:  Code stroke. LEFT-sided numbness for 30 minutes. History of hypertension.  EXAM: CT HEAD WITHOUT CONTRAST  TECHNIQUE: Contiguous axial images were obtained from the base of the skull through the vertex without intravenous contrast.  COMPARISON:  CT HEAD March 02, 2015  FINDINGS: BRAIN: No intraparenchymal hemorrhage, mass effect nor midline shift. Symmetric beam hardening artifact through the middle cranial fossas. The ventricles and sulci are normal. No acute large vascular territory infarcts. No abnormal extra-axial fluid collections. Basal cisterns are patent.  VASCULAR: Unremarkable.  SKULL/SOFT TISSUES: No skull fracture. No significant soft tissue swelling.  ORBITS/SINUSES: The included ocular globes and orbital contents are normal.The mastoid aircells and included paranasal sinuses are well-aerated.  OTHER: None.  ASPECTS [facility] Stroke Program  Early CT Score)  - Ganglionic level infarction (caudate, lentiform nuclei, internal capsule, insula, M1-M3 cortex): 7  - Supraganglionic infarction (M4-M6 cortex): 3  Total score (0-10 with 10 being normal): 10  IMPRESSION: 1. Normal noncontrast CT HEAD. 2. ASPECTS is 10.  Electronically Signed: By: Elon Alas M.D. On: 06/26/2016 13:45      ____________________________________________   PROCEDURES  Procedure(s) performed:  Procedures  Critical Care performed:   ____________________________________________   INITIAL IMPRESSION / ASSESSMENT AND PLAN / ED COURSE  Pertinent labs & imaging results that were available during my care of the patient were reviewed by me and considered in my medical decision making (see chart for details).         ____________________________________________   FINAL CLINICAL IMPRESSION(S) / ED DIAGNOSES  Final diagnoses:  Transient cerebral ischemia, unspecified type      NEW MEDICATIONS STARTED DURING THIS VISIT:  New Prescriptions   No medications on file     Note:  This document was prepared using Dragon voice recognition software and may include unintentional dictation errors.    Nena Polio, MD 06/26/16 1358    Nena Polio, MD 06/26/16 1400

## 2016-06-26 NOTE — ED Notes (Signed)
Patient @ MRI

## 2016-06-26 NOTE — ED Notes (Signed)
Patient r/f MRI

## 2016-06-26 NOTE — H&P (Signed)
[place] [facility] [place] NAME: Whitney Hart    MR#:  [id number]  DATE OF BIRTH:  03-Apr-1970  DATE OF ADMISSION:  06/26/2016  PRIMARY CARE PHYSICIAN:  REQUESTING/REFERRING PHYSICIAN: Nena Polio, MD   CHIEF COMPLAINT:  Left-sided weakness  HISTORY OF PRESENT ILLNESS:  Whitney Hart  is a 46 y.o. female with a known history of Hypertension and tobacco abuse disorder is presenting to the ED with a chief complaint of left-sided weakness and numbness associated with blurry vision. Patient's primary vision started from last night but left-sided numbness started at 1050 today. Initial CT head is negative. Code Stroke initiated by the ED physician. Patient is resting comfortably during my examination. Denies any dizziness or loss of consciousness. No similar complaints in the past  PAST MEDICAL HISTORY:   Past Medical History:  Diagnosis Date  . Hypertension     PAST SURGICAL HISTOIRY:  History reviewed. No pertinent surgical history.  SOCIAL HISTORY:   Social History  Substance Use Topics  . Smoking status: Current Every Day Smoker    Packs/day: 0.50    Types: Cigarettes  . Smokeless tobacco: Never Used  . Alcohol use Yes     Comment: occasionally    FAMILY HISTORY:   Family History  Problem Relation Age of Onset  . Breast cancer Neg Hx     DRUG ALLERGIES:  No Known Allergies  REVIEW OF SYSTEMS:  CONSTITUTIONAL: No fever, fatigue or weakness.  EYES: No blurred or double vision.  EARS, NOSE, AND THROAT: No tinnitus or ear pain.  RESPIRATORY: No cough, shortness of breath, wheezing or hemoptysis.  CARDIOVASCULAR: No chest pain, orthopnea, edema.  GASTROINTESTINAL: No nausea, vomiting, diarrhea or abdominal pain.  GENITOURINARY: No dysuria, hematuria.  ENDOCRINE: No polyuria, nocturia,  HEMATOLOGY: No anemia, easy bruising or bleeding SKIN: No rash or lesion. MUSCULOSKELETAL: No joint pain or arthritis.    NEUROLOGIC: REPORTS left sided intermittent tingling, numbness, no weakness.  PSYCHIATRY: No anxiety or depression.   MEDICATIONS AT HOME:   Prior to Admission medications   Medication Sig Start Date End Date Taking? Authorizing Provider  amLODipine (NORVASC) 10 MG tablet Take 10 mg by mouth daily. 04/18/14  Yes [provider]  amoxicillin (AMOXIL) 875 MG tablet Take 875 mg by mouth 2 (two) times daily.   Yes [provider]  aspirin EC 81 MG tablet Take 81 mg by mouth daily.   Yes [provider]  hydrochlorothiazide (HYDRODIURIL) 25 MG tablet Take 25 mg by mouth daily. 05/26/16  Yes [provider]  metoprolol succinate (TOPROL-XL) 100 MG 24 hr tablet Take 100 mg by mouth daily. 06/02/16  Yes [provider]  PROAIR HFA 108 (90 Base) MCG/ACT inhaler Inhale 2 puffs into the lungs every 4 (four) hours as needed. 06/18/15  Yes [provider]  spironolactone (ALDACTONE) 25 MG tablet Take 25 mg by mouth daily. 05/26/16  Yes [provider]  traMADol (ULTRAM) 50 MG tablet Take 50 mg by mouth 2 (two) times daily as needed. 06/02/16  Yes [provider]  triamcinolone cream (KENALOG) 0.5 % Apply 1 application topically daily as needed. 06/11/16  Yes [provider]  Vitamin D, Ergocalciferol, (DRISDOL) 50000 units CAPS capsule Take 50,000 Units by mouth every 7 (seven) days.   Yes [provider]  cyclobenzaprine (FLEXERIL) 10 MG tablet Take 1 tablet (10 mg total) by mouth 3 (three) times daily as needed for muscle spasms. Patient not taking:  Reported on 06/26/2016 08/13/15   Gregor Hams, MD  diazepam (VALIUM) 5 MG tablet Take 1 tablet (5 mg total) by mouth every 8 (eight) hours as needed for muscle spasms. Patient not taking: Reported on 06/26/2016 05/13/16   Earleen Newport, MD  ibuprofen (ADVIL,MOTRIN) 800 MG tablet Take 1 tablet (800 mg total) by mouth every 8 (eight) hours as needed. Patient not taking:  Reported on 06/26/2016 05/13/16   Earleen Newport, MD  meclizine (ANTIVERT) 25 MG tablet Take 1 tablet (25 mg total) by mouth 3 (three) times daily as needed for dizziness. Patient not taking: Reported on 06/26/2016 03/02/15   Harvest Dark, MD      VITAL SIGNS:  Blood pressure 111/65, pulse (!) 58, temperature 97.5 F (36.4 C), resp. rate 16, height 5\' 6"  (1.676 m), weight 96.2 kg (212 lb), last menstrual period 08/12/2015, SpO2 100 %.  PHYSICAL EXAMINATION:  GENERAL:  46 y.o.-year-old patient lying in the bed with no acute distress.  EYES: Pupils equal, round, reactive to light and accommodation. No scleral icterus. Extraocular muscles intact.  HEENT: Head atraumatic, normocephalic. Oropharynx and nasopharynx clear.  NECK:  Supple, no jugular venous distention. No thyroid enlargement, no tenderness.  LUNGS: Normal breath sounds bilaterally, no wheezing, rales,rhonchi or crepitation. No use of accessory muscles of respiration.  CARDIOVASCULAR: S1, S2 normal. No murmurs, rubs, or gallops.  ABDOMEN: Soft, nontender, nondistended. Bowel sounds present. No organomegaly or mass.  EXTREMITIES: No pedal edema, cyanosis, or clubbing.  NEUROLOGIC: Cranial nerves II through XII are intact. Muscle strength 5/5 in all extremities. Sensation intact. Gait not checked.  PSYCHIATRIC: The patient is alert and oriented x 3.  SKIN: No obvious rash, lesion, or ulcer.   LABORATORY PANEL:   CBC  Recent Labs Lab 06/26/16 1331  WBC 9.7  HGB 13.5  HCT 40.0  PLT 248   ------------------------------------------------------------------------------------------------------------------  Chemistries   Recent Labs Lab 06/26/16 1331  NA 135  K 3.6  CL 99*  CO2 26  GLUCOSE 103*  BUN 16  CREATININE 0.74  CALCIUM 9.2  AST 34  ALT 23  ALKPHOS 61  BILITOT 0.7   ------------------------------------------------------------------------------------------------------------------  Cardiac  Enzymes  Recent Labs Lab 06/26/16 1331  TROPONINI <0.03   ------------------------------------------------------------------------------------------------------------------  RADIOLOGY:  Ct Head Code Stroke W/o Cm  Addendum Date: 06/26/2016   ADDENDUM REPORT: 06/26/2016 13:52 ADDENDUM: Critical Value/emergent results were called by telephone at the time of interpretation on 06/26/2016 at 1:52 pm to Dr. Wells Guiles LORD , who verbally acknowledged these results. Electronically Signed   By: Elon Alas M.D.   On: 06/26/2016 13:52   Result Date: 06/26/2016 CLINICAL DATA:  Code stroke. LEFT-sided numbness for 30 minutes. History of hypertension. EXAM: CT HEAD WITHOUT CONTRAST TECHNIQUE: Contiguous axial images were obtained from the base of the skull through the vertex without intravenous contrast. COMPARISON:  CT HEAD March 02, 2015 FINDINGS: BRAIN: No intraparenchymal hemorrhage, mass effect nor midline shift. Symmetric beam hardening artifact through the middle cranial fossas. The ventricles and sulci are normal. No acute large vascular territory infarcts. No abnormal extra-axial fluid collections. Basal cisterns are patent. VASCULAR: Unremarkable. SKULL/SOFT TISSUES: No skull fracture. No significant soft tissue swelling. ORBITS/SINUSES: The included ocular globes and orbital contents are normal.The mastoid aircells and included paranasal sinuses are well-aerated. OTHER: None. ASPECTS [facility] Stroke Program Early CT Score) - Ganglionic level infarction (caudate, lentiform nuclei, internal capsule, insula, M1-M3 cortex): 7 - Supraganglionic infarction (M4-M6 cortex): 3 Total score (0-10 with 10 being normal):  10 IMPRESSION: 1. Normal noncontrast CT HEAD. 2. ASPECTS is 10. Electronically Signed: By: Elon Alas M.D. On: 06/26/2016 13:45    EKG:   Orders placed or performed during the hospital encounter of 06/26/16  . ED EKG  . ED EKG  . EKG 12-Lead  . EKG 12-Lead    IMPRESSION AND  PLAN:   Najah Liverman  is a 46 y.o. female with a known history of Hypertension and tobacco abuse disorder is presenting to the ED with a chief complaint of left-sided weakness and numbness associated with blurry vision. Patient's primary vision started from last night but left-sided numbness started at 1050 today. Initial CT head is negative.  # TIA with left sided weakness and numbness Admit patient to MedSurg unit CT head is negative. MRI is done and results are pending Aspirin 325 mg by mouth once daily after patient passes bedside swallow eval We'll get carotid Dopplers and 2-D echocardiogram Check TSH, hemoglobin A 1C and lipid panel in a.m. Neurology consult if needed PT, OT, speech therapy evaluation  #Essential hypertension Currently patient is nothing by mouth pending bedside swallow evaluation Blood pressure is soft, monitor closely. No antihypertensives needed at this time  #Obesity-lifestyle changes are recommended  #Tobacco abuse disorder Counselled  patient to quit smoking for 5 minutes. Patient verbalized understanding. Patient will be provided with 14 milligrams patch   DVT prophylaxis with Lovenox subcutaneous  All the records are reviewed and case discussed with ED provider. Management plans discussed with the patient, family and they are in agreement.  CODE STATUS: fc, fiancee Jeff Fergus HCPOA   TOTAL TIME TAKING CARE OF THIS PATIENT: 43  minutes.   Note: This dictation was prepared with Dragon dictation along with smaller phrase technology. Any transcriptional errors that result from this process are unintentional.  Nicholes Mango M.D on 06/26/2016 at 3:53 PM  Between 7am to 6pm - Pager - [phone]  After 6pm go to [email or web address] - password [email or web address] [facility]  Office  [phone]  CC: Primary care physician; System, Pcp Not In

## 2016-06-26 NOTE — ED Notes (Signed)
Code  stroke  called  to [phone] 

## 2016-06-26 NOTE — ED Notes (Signed)
Updated patient on MRI, pt to be transported to MRI at this time.

## 2016-06-26 NOTE — Progress Notes (Signed)
[place] responded to a page for Code Stroke of a Pt in ED02. [place] arrived when Dc and nurse were assessing the Pt. Pt was alert and responsive. Pt informed [facility] that a family member was waiting in the lobby area. [place] escorted Pt's family to the Room, and then prayed for Pt and family member. [place] informed Pt that [facility] services were available as needed.    06/26/16 1400  Clinical Encounter Type  Visited With Patient;Family  Visit Type Initial;Follow-up;Spiritual support;Code;Trauma  Referral From Nurse  Consult/Referral To Chaplain  Spiritual Encounters  Spiritual Needs Prayer;Emotional

## 2016-06-26 NOTE — ED Notes (Signed)
Pt talking to [facility] MD

## 2016-06-26 NOTE — ED Notes (Signed)
Pt transported to MRI 

## 2016-06-26 NOTE — ED Triage Notes (Signed)
At 1250pm today pt started with acute dizziness and left arm and leg numbness.  Could see self moving leg but not feel it.

## 2016-06-26 NOTE — ED Triage Notes (Signed)
Started with left arm/leg numbness 30 min ago. Can see herself moving leg/arm but cannot feel it

## 2016-06-27 LAB — LIPID PANEL
CHOLESTEROL: 228 mg/dL — AB (ref 0–200)
HDL: 32 mg/dL — ABNORMAL LOW (ref >40)
LDL Cholesterol: 164 mg/dL — ABNORMAL HIGH (ref 0–99)
Total CHOL/HDL Ratio: 7.1 RATIO
Triglycerides: 161 mg/dL — ABNORMAL HIGH (ref <150)
VLDL: 32 mg/dL (ref 0–40)

## 2016-06-27 LAB — TSH: TSH: 1.181 u[IU]/mL (ref 0.350–4.500)

## 2016-06-27 MED ORDER — ROSUVASTATIN CALCIUM 10 MG PO TABS: 10.0000 mg | ORAL_TABLET | Freq: Every day | ORAL | 0 refills | Status: DC

## 2016-06-27 MED ORDER — ASPIRIN 325 MG PO TBEC: 325.0000 mg | DELAYED_RELEASE_TABLET | Freq: Every day | ORAL | 0 refills | Status: AC

## 2016-06-27 MED ORDER — NICOTINE 14 MG/24HR TD PT24: 14.0000 mg | MEDICATED_PATCH | Freq: Every day | TRANSDERMAL | 0 refills | Status: DC

## 2016-06-27 NOTE — Progress Notes (Signed)
Pt being discharged home, discharge instructions and prescriptions reviewed with pt and husband, states understanding, no complaints at discharged, refused wheelchair

## 2016-06-27 NOTE — Discharge Summary (Signed)
[facility] [place] NAME: Whitney Hart    MR#:  [id number]  DATE OF BIRTH:  [birth year]  DATE OF ADMISSION:  06/26/2016 ADMITTING PHYSICIAN: Nicholes Mango, MD  DATE OF DISCHARGE: 06/27/2016  PRIMARY CARE PHYSICIAN: [place]    ADMISSION DIAGNOSIS:  TIA (transient ischemic attack) [G45.9] Transient cerebral ischemia, unspecified type [G45.9]  DISCHARGE DIAGNOSIS:  Active Problems:   TIA (transient ischemic attack)   SECONDARY DIAGNOSIS:   Past Medical History:  Diagnosis Date  . Hypertension     HOSPITAL COURSE:   46 year old female with a history of essential hypertension and tobacco dependence who presents with left-sided weakness and blurred vision.  1. TIA with left-sided weakness and numbness which has resolved. MRI did not show CVA. Patient does not want to wait for echocardiogram. This can be arranged as an outpatient with her PCP at [facility]. She will start aspirin 325 daily and statin.  2. Essential hypertension: Patient will continue outpatient medications  3. Tobacco dependence: Patient is encouraged to quit smoking. Counseling was provided for 4 minutes.   DISCHARGE CONDITIONS AND DIET:  Stable Heart healthy diet  CONSULTS OBTAINED:    DRUG ALLERGIES:  No Known Allergies  DISCHARGE MEDICATIONS:   Current Discharge Medication List    START taking these medications   Details  nicotine (NICODERM CQ - DOSED IN MG/24 HOURS) 14 mg/24hr patch Place 1 patch (14 mg total) onto the skin daily. Qty: 28 patch, Refills: 0    rosuvastatin (CRESTOR) 10 MG tablet Take 1 tablet (10 mg total) by mouth daily. Qty: 30 tablet, Refills: 0      CONTINUE these medications which have CHANGED   Details  aspirin EC 325 MG EC tablet Take 1 tablet (325 mg total) by mouth daily. Qty: 30 tablet, Refills: 0      CONTINUE these medications which have NOT CHANGED   Details  amLODipine (NORVASC) 10 MG tablet  Take 10 mg by mouth daily.    hydrochlorothiazide (HYDRODIURIL) 25 MG tablet Take 25 mg by mouth daily. Refills: 2    metoprolol succinate (TOPROL-XL) 100 MG 24 hr tablet Take 100 mg by mouth daily. Refills: 0    PROAIR HFA 108 (90 Base) MCG/ACT inhaler Inhale 2 puffs into the lungs every 4 (four) hours as needed. Refills: 0    spironolactone (ALDACTONE) 25 MG tablet Take 25 mg by mouth daily. Refills: 1    traMADol (ULTRAM) 50 MG tablet Take 50 mg by mouth 2 (two) times daily as needed. Refills: 0    triamcinolone cream (KENALOG) 0.5 % Apply 1 application topically daily as needed. Refills: 0    Vitamin D, Ergocalciferol, (DRISDOL) 50000 units CAPS capsule Take 50,000 Units by mouth every 7 (seven) days.    cyclobenzaprine (FLEXERIL) 10 MG tablet Take 1 tablet (10 mg total) by mouth 3 (three) times daily as needed for muscle spasms. Qty: 30 tablet, Refills: 0    meclizine (ANTIVERT) 25 MG tablet Take 1 tablet (25 mg total) by mouth 3 (three) times daily as needed for dizziness. Qty: 30 tablet, Refills: 0      STOP taking these medications     amoxicillin (AMOXIL) 875 MG tablet      diazepam (VALIUM) 5 MG tablet      ibuprofen (ADVIL,MOTRIN) 800 MG tablet           Today   CHIEF COMPLAINT:  Patient without any symptoms. Patient would like to  be discharged.   VITAL SIGNS:  Blood pressure 106/66, pulse (!) 58, temperature 97.8 F (36.6 C), temperature source Oral, resp. rate 14, height 5\' 6"  (1.676 m), weight 96.2 kg (212 lb), last menstrual period 08/12/2015, SpO2 98 %.   REVIEW OF SYSTEMS:  Review of Systems  Constitutional: Negative.  Negative for chills, fever and malaise/fatigue.  HENT: Negative.  Negative for ear discharge, ear pain, hearing loss, nosebleeds and sore throat.   Eyes: Negative.  Negative for blurred vision and pain.  Respiratory: Negative.  Negative for cough, hemoptysis, shortness of breath and wheezing.   Cardiovascular: Negative.   Negative for chest pain, palpitations and leg swelling.  Gastrointestinal: Negative.  Negative for abdominal pain, blood in stool, diarrhea, nausea and vomiting.  Genitourinary: Negative.  Negative for dysuria.  Musculoskeletal: Negative.  Negative for back pain.  Skin: Negative.   Neurological: Negative for dizziness, tremors, speech change, focal weakness, seizures and headaches.  Endo/Heme/Allergies: Negative.  Does not bruise/bleed easily.  Psychiatric/Behavioral: Negative.  Negative for depression, hallucinations and suicidal ideas.     PHYSICAL EXAMINATION:  GENERAL:  46 y.o.-year-old patient lying in the bed with no acute distress.  NECK:  Supple, no jugular venous distention. No thyroid enlargement, no tenderness.  LUNGS: Normal breath sounds bilaterally, no wheezing, rales,rhonchi  No use of accessory muscles of respiration.  CARDIOVASCULAR: S1, S2 normal. No murmurs, rubs, or gallops.  ABDOMEN: Soft, non-tender, non-distended. Bowel sounds present. No organomegaly or mass.  EXTREMITIES: No pedal edema, cyanosis, or clubbing.  PSYCHIATRIC: The patient is alert and oriented x 3.  SKIN: No obvious rash, lesion, or ulcer.   DATA REVIEW:   CBC  Recent Labs Lab 06/26/16 1331  WBC 9.7  HGB 13.5  HCT 40.0  PLT 248    Chemistries   Recent Labs Lab 06/26/16 1331  NA 135  K 3.6  CL 99*  CO2 26  GLUCOSE 103*  BUN 16  CREATININE 0.74  CALCIUM 9.2  AST 34  ALT 23  ALKPHOS 61  BILITOT 0.7    Cardiac Enzymes  Recent Labs Lab 06/26/16 1331  TROPONINI <0.03    Microbiology Results  @[email or web address] @  RADIOLOGY:  Dg Chest 2 View  Result Date: 06/26/2016 CLINICAL DATA:  Numbness to the LT side of her body today. EXAM: CHEST  2 VIEW COMPARISON:  None. FINDINGS: The heart size and mediastinal contours are within normal limits. Both lungs are clear. The visualized skeletal structures are unremarkable. IMPRESSION: No active cardiopulmonary disease. Electronically  Signed   By: Marin Olp M.D.   On: 06/26/2016 15:55   Mr Brain Wo Contrast  Result Date: 06/26/2016 CLINICAL DATA:  Sudden onset of left upper and lower extremity numbness and weakness. Symptoms began 3 hours ago. Strength has significantly improved. EXAM: MRI HEAD WITHOUT CONTRAST TECHNIQUE: Multiplanar, multiecho pulse sequences of the brain and surrounding structures were obtained without intravenous contrast. COMPARISON:  CT head without contrast from the same day. FINDINGS: Brain: The diffusion-weighted images demonstrate no evidence for acute or subacute infarction. No acute hemorrhage or mass lesion is present. The ventricles are of normal size. No significant extra-axial fluid collection is present. Scattered subcortical T2 hyperintensities are mildly advanced for age. Vascular: Flow is present in the major intracranial arteries. Skull and upper cervical spine: The craniocervical junction is within normal limits. Midline sagittal structures are unremarkable. The upper cervical spine is within normal limits. Sinuses/Orbits: The paranasal sinuses are clear. Minimal fluid is present in the left mastoid air cells.  The right mastoid air cells are clear. No obstructing nasopharyngeal lesion is present. IMPRESSION: 1. Negative MRI the brain. No acute or focal abnormality to explain the patient's transient symptoms. Electronically Signed   By: San Morelle M.D.   On: 06/26/2016 15:58   Ct Head Code Stroke W/o Cm  Addendum Date: 06/26/2016   ADDENDUM REPORT: 06/26/2016 13:52 ADDENDUM: Critical Value/emergent results were called by telephone at the time of interpretation on 06/26/2016 at 1:52 pm to Dr. Lisa Roca , who verbally acknowledged these results. Electronically Signed   By: Elon Alas M.D.   On: 06/26/2016 13:52   Result Date: 06/26/2016 CLINICAL DATA:  Code stroke. LEFT-sided numbness for 30 minutes. History of hypertension. EXAM: CT HEAD WITHOUT CONTRAST TECHNIQUE: Contiguous  axial images were obtained from the base of the skull through the vertex without intravenous contrast. COMPARISON:  CT HEAD March 02, 2015 FINDINGS: BRAIN: No intraparenchymal hemorrhage, mass effect nor midline shift. Symmetric beam hardening artifact through the middle cranial fossas. The ventricles and sulci are normal. No acute large vascular territory infarcts. No abnormal extra-axial fluid collections. Basal cisterns are patent. VASCULAR: Unremarkable. SKULL/SOFT TISSUES: No skull fracture. No significant soft tissue swelling. ORBITS/SINUSES: The included ocular globes and orbital contents are normal.The mastoid aircells and included paranasal sinuses are well-aerated. OTHER: None. ASPECTS [facility] Stroke Program Early CT Score) - Ganglionic level infarction (caudate, lentiform nuclei, internal capsule, insula, M1-M3 cortex): 7 - Supraganglionic infarction (M4-M6 cortex): 3 Total score (0-10 with 10 being normal): 10 IMPRESSION: 1. Normal noncontrast CT HEAD. 2. ASPECTS is 10. Electronically Signed: By: Elon Alas M.D. On: 06/26/2016 13:45      Current Discharge Medication List    START taking these medications   Details  nicotine (NICODERM CQ - DOSED IN MG/24 HOURS) 14 mg/24hr patch Place 1 patch (14 mg total) onto the skin daily. Qty: 28 patch, Refills: 0    rosuvastatin (CRESTOR) 10 MG tablet Take 1 tablet (10 mg total) by mouth daily. Qty: 30 tablet, Refills: 0      CONTINUE these medications which have CHANGED   Details  aspirin EC 325 MG EC tablet Take 1 tablet (325 mg total) by mouth daily. Qty: 30 tablet, Refills: 0      CONTINUE these medications which have NOT CHANGED   Details  amLODipine (NORVASC) 10 MG tablet Take 10 mg by mouth daily.    hydrochlorothiazide (HYDRODIURIL) 25 MG tablet Take 25 mg by mouth daily. Refills: 2    metoprolol succinate (TOPROL-XL) 100 MG 24 hr tablet Take 100 mg by mouth daily. Refills: 0    PROAIR HFA 108 (90 Base) MCG/ACT  inhaler Inhale 2 puffs into the lungs every 4 (four) hours as needed. Refills: 0    spironolactone (ALDACTONE) 25 MG tablet Take 25 mg by mouth daily. Refills: 1    traMADol (ULTRAM) 50 MG tablet Take 50 mg by mouth 2 (two) times daily as needed. Refills: 0    triamcinolone cream (KENALOG) 0.5 % Apply 1 application topically daily as needed. Refills: 0    Vitamin D, Ergocalciferol, (DRISDOL) 50000 units CAPS capsule Take 50,000 Units by mouth every 7 (seven) days.    cyclobenzaprine (FLEXERIL) 10 MG tablet Take 1 tablet (10 mg total) by mouth 3 (three) times daily as needed for muscle spasms. Qty: 30 tablet, Refills: 0    meclizine (ANTIVERT) 25 MG tablet Take 1 tablet (25 mg total) by mouth 3 (three) times daily as needed for dizziness. Qty: 30 tablet,  Refills: 0      STOP taking these medications     amoxicillin (AMOXIL) 875 MG tablet      diazepam (VALIUM) 5 MG tablet      ibuprofen (ADVIL,MOTRIN) 800 MG tablet           Management plans discussed with the patient and she is in agreement. Stable for discharge home  Patient should follow up with pcp  CODE STATUS:     Code Status Orders        Start     Ordered   06/26/16 1619  Full code  Continuous     06/26/16 1618    Code Status History    Date Active Date Inactive Code Status Order ID Comments User Context   This patient has a current code status but no historical code status.      TOTAL TIME TAKING CARE OF THIS PATIENT: 37 minutes.    Note: This dictation was prepared with Dragon dictation along with smaller phrase technology. Any transcriptional errors that result from this process are unintentional.  Shaughn Thomley M.D on 06/27/2016 at 7:36 AM  Between 7am to 6pm - Pager - [phone] After 6pm go to [email or web address] - password [email or web address] [facility]  Office  [phone]  CC: Primary care physician; [facility]

## 2016-06-29 LAB — HEMOGLOBIN A1C
HEMOGLOBIN A1C: 6 % — AB (ref 4.8–5.6)
MEAN PLASMA GLUCOSE: 126 mg/dL

## 2016-06-29 LAB — HIV ANTIBODY (ROUTINE TESTING W REFLEX): HIV SCREEN 4TH GENERATION: NONREACTIVE

## 2016-10-02 ENCOUNTER — Emergency Department
Admission: EM | Admit: 2016-10-02 | Discharge: 2016-10-02 | Disposition: A | Discharge status: Home / Self Care | Payer: BLUE CROSS/BLUE SHIELD | Attending: Emergency Medicine | Admitting: Emergency Medicine

## 2016-10-02 DIAGNOSIS — R509 Fever, unspecified: Secondary | ICD-10-CM | POA: Insufficient documentation

## 2016-10-02 DIAGNOSIS — M791 Myalgia: Secondary | ICD-10-CM | POA: Insufficient documentation

## 2016-10-02 DIAGNOSIS — Z5329 Procedure and treatment not carried out because of patient's decision for other reasons: Secondary | ICD-10-CM | POA: Diagnosis not present

## 2016-10-02 LAB — URINALYSIS, COMPLETE (UACMP) WITH MICROSCOPIC
BACTERIA UA: NONE SEEN
Bilirubin Urine: NEGATIVE
Glucose, UA: NEGATIVE mg/dL
Ketones, ur: NEGATIVE mg/dL
LEUKOCYTES UA: NEGATIVE
NITRITE: NEGATIVE
PROTEIN: NEGATIVE mg/dL
Specific Gravity, Urine: 1.008 (ref 1.005–1.030)
pH: 6 (ref 5.0–8.0)

## 2016-10-02 LAB — COMPREHENSIVE METABOLIC PANEL
ALK PHOS: 65 U/L (ref 38–126)
ALT: 15 U/L (ref 14–54)
ANION GAP: 9 (ref 5–15)
AST: 23 U/L (ref 15–41)
Albumin: 4.1 g/dL (ref 3.5–5.0)
BILIRUBIN TOTAL: 0.6 mg/dL (ref 0.3–1.2)
BUN: 9 mg/dL (ref 6–20)
CALCIUM: 9.3 mg/dL (ref 8.9–10.3)
CO2: 25 mmol/L (ref 22–32)
Chloride: 101 mmol/L (ref 101–111)
Creatinine, Ser: 0.66 mg/dL (ref 0.44–1.00)
Glucose, Bld: 113 mg/dL — ABNORMAL HIGH (ref 65–99)
POTASSIUM: 4 mmol/L (ref 3.5–5.1)
Sodium: 135 mmol/L (ref 135–145)
TOTAL PROTEIN: 7.7 g/dL (ref 6.5–8.1)

## 2016-10-02 LAB — CBC
HEMATOCRIT: 41.3 % (ref 35.0–47.0)
HEMOGLOBIN: 14 g/dL (ref 12.0–16.0)
MCH: 28.3 pg (ref 26.0–34.0)
MCHC: 33.9 g/dL (ref 32.0–36.0)
MCV: 83.5 fL (ref 80.0–100.0)
Platelets: 231 10*3/uL (ref 150–440)
RBC: 4.95 MIL/uL (ref 3.80–5.20)
RDW: 15.4 % — ABNORMAL HIGH (ref 11.5–14.5)
WBC: 13.1 10*3/uL — AB (ref 3.6–11.0)

## 2016-10-02 LAB — LIPASE, BLOOD: Lipase: 30 U/L (ref 11–51)

## 2016-10-02 MED ORDER — ONDANSETRON 4 MG PO TBDP
4.0000 mg | ORAL_TABLET | Freq: Once | ORAL | Status: AC | PRN
  Administered 2016-10-02: 4 mg via ORAL
  Filled 2016-10-02: qty 1

## 2016-10-02 NOTE — ED Triage Notes (Signed)
Patient c/o nausea, sinus drainage emesis, fever and bilateral lower leg pain beginning on Monday after returning from trip to [place].  Patient was seen by PCP and dx with sinus infection and given azithromycin Thursday.  Pt denies any relief of symptoms.  Patient's TMax 102 at home.

## 2016-10-04 ENCOUNTER — Telehealth: Payer: Self-pay | Admitting: Emergency Medicine

## 2016-10-04 NOTE — Telephone Encounter (Signed)
Called patient due to lwot to inquire about condition and follow up plans. She says she is actually feeling better.  Says she gets a lot of sinus infections. I asked her to let her pcp at [facility] know about labs done here.

## 2016-10-15 ENCOUNTER — Other Ambulatory Visit: Payer: Self-pay | Admitting: Unknown Physician Specialty

## 2016-10-15 DIAGNOSIS — J324 Chronic pansinusitis: Secondary | ICD-10-CM

## 2016-10-27 ENCOUNTER — Ambulatory Visit: Payer: BLUE CROSS/BLUE SHIELD

## 2016-11-02 ENCOUNTER — Ambulatory Visit: Admission: RE | Admit: 2016-11-02 | Payer: BLUE CROSS/BLUE SHIELD | Source: Ambulatory Visit

## 2017-02-18 ENCOUNTER — Other Ambulatory Visit: Payer: Self-pay | Admitting: Otolaryngology

## 2017-02-18 ENCOUNTER — Ambulatory Visit
Admission: RE | Admit: 2017-02-18 | Discharge: 2017-02-18 | Disposition: A | Discharge status: Home / Self Care | Payer: BLUE CROSS/BLUE SHIELD | Source: Ambulatory Visit | Attending: Otolaryngology | Admitting: Otolaryngology

## 2017-02-18 DIAGNOSIS — J320 Chronic maxillary sinusitis: Secondary | ICD-10-CM

## 2017-03-14 ENCOUNTER — Other Ambulatory Visit: Payer: Self-pay

## 2017-03-14 ENCOUNTER — Emergency Department: Payer: BLUE CROSS/BLUE SHIELD

## 2017-03-14 ENCOUNTER — Encounter: Payer: Self-pay | Admitting: Emergency Medicine

## 2017-03-14 ENCOUNTER — Emergency Department
Admission: EM | Admit: 2017-03-14 | Discharge: 2017-03-14 | Disposition: A | Discharge status: Home / Self Care | Payer: BLUE CROSS/BLUE SHIELD | Attending: Student in an Organized Health Care Education/Training Program | Admitting: Student in an Organized Health Care Education/Training Program

## 2017-03-14 DIAGNOSIS — M19012 Primary osteoarthritis, left shoulder: Secondary | ICD-10-CM | POA: Insufficient documentation

## 2017-03-14 DIAGNOSIS — M25512 Pain in left shoulder: Secondary | ICD-10-CM | POA: Diagnosis present

## 2017-03-14 DIAGNOSIS — Z8673 Personal history of transient ischemic attack (TIA), and cerebral infarction without residual deficits: Secondary | ICD-10-CM | POA: Diagnosis not present

## 2017-03-14 DIAGNOSIS — Z79899 Other long term (current) drug therapy: Secondary | ICD-10-CM | POA: Insufficient documentation

## 2017-03-14 DIAGNOSIS — F17210 Nicotine dependence, cigarettes, uncomplicated: Secondary | ICD-10-CM | POA: Diagnosis not present

## 2017-03-14 DIAGNOSIS — I10 Essential (primary) hypertension: Secondary | ICD-10-CM | POA: Insufficient documentation

## 2017-03-14 MED ORDER — MELOXICAM 15 MG PO TABS: 15.0000 mg | ORAL_TABLET | Freq: Every day | ORAL | 0 refills | Status: AC

## 2017-03-14 MED ORDER — NAPROXEN 500 MG PO TABS
500.0000 mg | ORAL_TABLET | Freq: Once | ORAL | Status: AC
  Administered 2017-03-14: 500 mg via ORAL
  Filled 2017-03-14: qty 1

## 2017-03-14 MED ORDER — TRAMADOL HCL 50 MG PO TABS: 50.0000 mg | ORAL_TABLET | Freq: Four times a day (QID) | ORAL | 0 refills | Status: AC | PRN

## 2017-03-14 NOTE — Discharge Instructions (Signed)
Arm sling for 2 days.  Take medication as directed.  Follow-up with PCP for continued care.

## 2017-03-14 NOTE — ED Notes (Signed)
After triage pt noted to be putting lotion/cream on her arms; pt reached back with her

## 2017-03-14 NOTE — ED Notes (Signed)
See triage note  Presents with pain to left posterior shoulder for the past few days  Denies any injury  Has tried lidocaine patch and aleve with min relief   No deformity noted   Pain increases with movement

## 2017-03-14 NOTE — ED Provider Notes (Signed)
[facility] Emergency Department Provider Note   ____________________________________________   First MD Initiated Contact with Patient 03/14/17 [phone]     (approximate)  I have reviewed the triage vital signs and the nursing notes.   HISTORY  Chief Complaint Shoulder Pain    HPI Whitney Hart is a 47 y.o. female patient complains of left shoulder pain for 2 weeks.  Patient said pain is worse in the past several days.  Patient did pain increases with movement of the shoulder.  Patient denies provocative incident for this complaint.  Patient rates pain as a 10/10.  Patient described the pain is "aching".  No palates measured for complaint.  Past Medical History:  Diagnosis Date  . Hypertension     Patient Active Problem List   Diagnosis Date Noted  . TIA (transient ischemic attack) 06/26/2016    History reviewed. No pertinent surgical history.  Prior to Admission medications   Medication Sig Start Date End Date Taking? Authorizing Provider  amLODipine (NORVASC) 10 MG tablet Take 10 mg by mouth daily. 04/18/14  Yes [provider]  aspirin EC 325 MG EC tablet Take 1 tablet (325 mg total) by mouth daily. 06/27/16  Yes Mody, Ulice Bold, MD  hydrochlorothiazide (HYDRODIURIL) 25 MG tablet Take 25 mg by mouth daily. 05/26/16  Yes [provider]  metoprolol succinate (TOPROL-XL) 100 MG 24 hr tablet Take 100 mg by mouth daily. 06/02/16  Yes [provider]  PROAIR HFA 108 (90 Base) MCG/ACT inhaler Inhale 2 puffs into the lungs every 4 (four) hours as needed. 06/18/15  Yes [provider]  spironolactone (ALDACTONE) 25 MG tablet Take 25 mg by mouth daily. 05/26/16  Yes [provider]  traMADol (ULTRAM) 50 MG tablet Take 50 mg by mouth 2 (two) times daily as needed. 06/02/16  Yes [provider]  triamcinolone cream (KENALOG) 0.5 % Apply 1 application topically daily as needed. 06/11/16  Yes [provider]    meloxicam (MOBIC) 15 MG tablet Take 1 tablet (15 mg total) by mouth daily. 03/14/17   Sable Feil, PA-C  traMADol (ULTRAM) 50 MG tablet Take 1 tablet (50 mg total) by mouth every 6 (six) hours as needed for moderate pain. 03/14/17   Sable Feil, PA-C  Vitamin D, Ergocalciferol, (DRISDOL) 50000 units CAPS capsule Take 50,000 Units by mouth every 7 (seven) days.    [provider]    Allergies Patient has no known allergies.  Family History  Problem Relation Age of Onset  . Breast cancer Neg Hx     Social History Social History   Tobacco Use  . Smoking status: Current Every Day Smoker    Packs/day: 0.50    Types: Cigarettes  . Smokeless tobacco: Never Used  Substance Use Topics  . Alcohol use: Yes    Comment: occasionally  . Drug use: No    Review of Systems Constitutional: No fever/chills Eyes: No visual changes. ENT: No sore throat. Cardiovascular: Denies chest pain. Respiratory: Denies shortness of breath. Gastrointestinal: No abdominal pain.  No nausea, no vomiting.  No diarrhea.  No constipation. Genitourinary: Negative for dysuria. Musculoskeletal: Negative for back pain. Skin: Negative for rash. Neurological: Negative for headaches, focal weakness or numbness. Endocrine:Hypertension ____________________________________________   PHYSICAL EXAM:  VITAL SIGNS: ED Triage Vitals [03/14/17 0635]  Enc Vitals Group     BP (!) 158/98     Pulse Rate 100     Resp 19     Temp 98.3  F (36.8 C)     Temp Source Oral     SpO2 100 %     Weight 220 lb (99.8 kg)     Height 5\' 7"  (1.702 m)     Head Circumference      Peak Flow      Pain Score 10     Pain Loc      Pain Edu?      Excl. in [place]?    Constitutional: Alert and oriented. Well appearing and in no acute distress. Neck: No stridor.  No cervical spine tenderness to palpation. Hematological/Lymphatic/Immunilogical: No cervical lymphadenopathy. Cardiovascular: Normal rate, regular rhythm. Grossly  normal heart sounds.  Good peripheral circulation.  Elevated blood pressure Respiratory: Normal respiratory effort.  No retractions. Lungs CTAB. Musculoskeletal: No obvious left shoulder deformity.  Patient has moderate guarding palpation lateral aspect of the humeral head.  Patient has decreased range of motion with abduction and overhead reaching. Neurologic:  Normal speech and language. No gross focal neurologic deficits are appreciated. No gait instability. Skin:  Skin is warm, dry and intact. No rash noted. Psychiatric: Mood and affect are normal. Speech and behavior are normal.  ____________________________________________   LABS (all labs ordered are listed, but only abnormal results are displayed)  Labs Reviewed - No data to display ____________________________________________  EKG   ____________________________________________  RADIOLOGY  ED MD interpretation: No acute findings mild arthritic changes.  Official radiology report(s):   ____________________________________________   PROCEDURES  Procedure(s) performed: None  Procedures  Critical Care performed: No  ____________________________________________   INITIAL IMPRESSION / ASSESSMENT AND PLAN / ED COURSE  As part of my medical decision making, I reviewed the following data within the electronic MEDICAL RECORD NUMBER    Left shoulder pain secondary to arthritis.  Discussed x-ray findings with patient.  Patient given discharge care instruction advised take medication as directed.  Patient advised to follow-up PCP for continued care.      ____________________________________________   FINAL CLINICAL IMPRESSION(S) / ED DIAGNOSES  Final diagnoses:  Primary osteoarthritis of left shoulder     ED Discharge Orders        Ordered    meloxicam (MOBIC) 15 MG tablet  Daily     03/14/17 0753    traMADol (ULTRAM) 50 MG tablet  Every 6 hours PRN     03/14/17 0753       Note:  This document was  prepared using Dragon voice recognition software and may include unintentional dictation errors.    Sable Feil, PA-C 03/14/17 0759    Merlyn Lot, MD 03/14/17 [phone]

## 2017-03-14 NOTE — ED Triage Notes (Signed)
Pt reports left shoulder pain for several day; pain is worse with any movement, especially lifting it; denies injury;

## 2017-10-04 ENCOUNTER — Other Ambulatory Visit: Payer: Self-pay | Admitting: Nurse Practitioner

## 2017-10-04 DIAGNOSIS — Z1231 Encounter for screening mammogram for malignant neoplasm of breast: Secondary | ICD-10-CM

## 2017-11-01 ENCOUNTER — Ambulatory Visit
Admission: RE | Admit: 2017-11-01 | Discharge: 2017-11-01 | Disposition: A | Discharge status: Home / Self Care | Payer: BLUE CROSS/BLUE SHIELD | Source: Ambulatory Visit | Attending: Nurse Practitioner | Admitting: Nurse Practitioner

## 2017-11-01 DIAGNOSIS — Z1231 Encounter for screening mammogram for malignant neoplasm of breast: Secondary | ICD-10-CM | POA: Insufficient documentation

## 2017-12-12 ENCOUNTER — Emergency Department
Admission: EM | Admit: 2017-12-12 | Discharge: 2017-12-12 | Disposition: A | Discharge status: Home / Self Care | Payer: BLUE CROSS/BLUE SHIELD | Attending: Emergency Medicine | Admitting: Emergency Medicine

## 2017-12-12 ENCOUNTER — Other Ambulatory Visit: Payer: Self-pay

## 2017-12-12 ENCOUNTER — Emergency Department: Payer: BLUE CROSS/BLUE SHIELD

## 2017-12-12 ENCOUNTER — Encounter: Payer: Self-pay | Admitting: Physician Assistant

## 2017-12-12 DIAGNOSIS — J069 Acute upper respiratory infection, unspecified: Secondary | ICD-10-CM

## 2017-12-12 DIAGNOSIS — Z79899 Other long term (current) drug therapy: Secondary | ICD-10-CM | POA: Diagnosis not present

## 2017-12-12 DIAGNOSIS — M7918 Myalgia, other site: Secondary | ICD-10-CM | POA: Diagnosis present

## 2017-12-12 DIAGNOSIS — Z7982 Long term (current) use of aspirin: Secondary | ICD-10-CM | POA: Diagnosis not present

## 2017-12-12 DIAGNOSIS — R05 Cough: Secondary | ICD-10-CM | POA: Diagnosis not present

## 2017-12-12 DIAGNOSIS — I10 Essential (primary) hypertension: Secondary | ICD-10-CM | POA: Insufficient documentation

## 2017-12-12 DIAGNOSIS — F17210 Nicotine dependence, cigarettes, uncomplicated: Secondary | ICD-10-CM | POA: Diagnosis not present

## 2017-12-12 MED ORDER — BENZONATATE 200 MG PO CAPS: 200.0000 mg | ORAL_CAPSULE | Freq: Three times a day (TID) | ORAL | 0 refills | Status: AC | PRN

## 2017-12-12 MED ORDER — AZITHROMYCIN 250 MG PO TABS: ORAL_TABLET | ORAL | 0 refills | Status: AC

## 2017-12-12 NOTE — ED Provider Notes (Signed)
[facility] Emergency Department Provider Note  ____________________________________________   First MD Initiated Contact with Patient 12/12/17 1009     (approximate)  I have reviewed the triage vital signs and the nursing notes.   HISTORY  Chief Complaint Generalized Body Aches and Cough    HPI Whitney Hart is a 47 y.o. femalepresents emergency department complaining of cough and congestion with yellow to green mucus.  Some upper back pain.  Symptoms for 3 days.  She states that she was getting a sinus infection prior to getting a flu shot.  Got her flu shot on Thursday and started feeling worse.  She is unsure if she had a fever but she did feel hot last night.  Denies chills, chest pain or shortness of breath.   Past Medical History:  Diagnosis Date  . Hypertension     Patient Active Problem List   Diagnosis Date Noted  . TIA (transient ischemic attack) 06/26/2016    History reviewed. No pertinent surgical history.  Prior to Admission medications   Medication Sig Start Date End Date Taking? Authorizing Provider  amLODipine (NORVASC) 10 MG tablet Take 10 mg by mouth daily. 04/18/14   [provider]  aspirin EC 325 MG EC tablet Take 1 tablet (325 mg total) by mouth daily. 06/27/16   Bettey Costa, MD  azithromycin (ZITHROMAX Z-PAK) 250 MG tablet 2 pills today then 1 pill a day for 4 days 12/12/17   Caryn Section Linden Dolin, PA-C  benzonatate (TESSALON) 200 MG capsule Take 1 capsule (200 mg total) by mouth 3 (three) times daily as needed for cough. 12/12/17   , Linden Dolin, PA-C  hydrochlorothiazide (HYDRODIURIL) 25 MG tablet Take 25 mg by mouth daily. 05/26/16   [provider]  meloxicam (MOBIC) 15 MG tablet Take 1 tablet (15 mg total) by mouth daily. 03/14/17   Sable Feil, PA-C  metoprolol succinate (TOPROL-XL) 100 MG 24 hr tablet Take 100 mg by mouth daily. 06/02/16   [provider]  PROAIR HFA 108 (90 Base) MCG/ACT inhaler  Inhale 2 puffs into the lungs every 4 (four) hours as needed. 06/18/15   [provider]  spironolactone (ALDACTONE) 25 MG tablet Take 25 mg by mouth daily. 05/26/16   [provider]  traMADol (ULTRAM) 50 MG tablet Take 50 mg by mouth 2 (two) times daily as needed. 06/02/16   [provider]  traMADol (ULTRAM) 50 MG tablet Take 1 tablet (50 mg total) by mouth every 6 (six) hours as needed for moderate pain. 03/14/17   Sable Feil, PA-C  triamcinolone cream (KENALOG) 0.5 % Apply 1 application topically daily as needed. 06/11/16   [provider]  Vitamin D, Ergocalciferol, (DRISDOL) 50000 units CAPS capsule Take 50,000 Units by mouth every 7 (seven) days.    [provider]    Allergies Lisinopril and Varenicline  Family History  Problem Relation Age of Onset  . Breast cancer Neg Hx     Social History Social History   Tobacco Use  . Smoking status: Current Every Day Smoker    Packs/day: 0.50    Types: Cigarettes  . Smokeless tobacco: Never Used  Substance Use Topics  . Alcohol use: Yes    Comment: occasionally  . Drug use: No    Review of Systems  Constitutional: No fever/chills Eyes: No visual changes. ENT: No sore throat. Respiratory: Positive cough and congestion, positive wheezing Genitourinary: Negative for dysuria. Musculoskeletal: Negative for back pain. Skin: Negative  for rash.    ____________________________________________   PHYSICAL EXAM:  VITAL SIGNS: ED Triage Vitals  Enc Vitals Group     BP 12/12/17 0900 132/82     Pulse Rate 12/12/17 0900 74     Resp 12/12/17 0900 18     Temp 12/12/17 0900 99.1 F (37.3 C)     Temp Source 12/12/17 0900 Oral     SpO2 12/12/17 0900 99 %     Weight 12/12/17 0910 209 lb (94.8 kg)     Height 12/12/17 0910 5\' 6"  (1.676 m)     Head Circumference --      Peak Flow --      Pain Score 12/12/17 0909 8     Pain Loc --      Pain Edu? --      Excl. in [place]? --      Constitutional: Alert and oriented. Well appearing and in no acute distress. Eyes: Conjunctivae are normal.  Head: Atraumatic. ENT: [place]  clear Nose: No congestion/rhinnorhea. Mouth/Throat: Mucous membranes are moist.   NECK: Is supple, no lymphadenopathy is noted  cardiovascular: Normal rate, regular rhythm.  Heart sounds are normal Respiratory: Normal respiratory effort.  No retractions, lungs with coarse breath sounds in the lower lungs GU: deferred Musculoskeletal: FROM all extremities, warm and well perfused Neurologic:  Normal speech and language.  Skin:  Skin is warm, dry and intact. No rash noted. Psychiatric: Mood and affect are normal. Speech and behavior are normal.  ____________________________________________   LABS (all labs ordered are listed, but only abnormal results are displayed)  Labs Reviewed - No data to display ____________________________________________   ____________________________________________  RADIOLOGY  Chest x-ray is negative  ____________________________________________   PROCEDURES  Procedure(s) performed: No  Procedures    ____________________________________________   INITIAL IMPRESSION / ASSESSMENT AND PLAN / ED COURSE  Pertinent labs & imaging results that were available during my care of the patient were reviewed by me and considered in my medical decision making (see chart for details).   Patient is a 47 year old female presents emergency department with upper respiratory and symptoms.  She had flu shot on Thursday and got worse.  Physical exam patient appears well.  The lungs have coarse breath sounds noted in the lower lung fields.  Cough is dry and hacking.  Remainder the exam is unremarkable  Chest x-ray is negative for pneumonia  Explained findings to the patient.  She is given a prescription for Z-Pak and Tessalon Perles.  She is to follow-up with regular doctor if not better in 3 days.  Return emergency  department worsening.  She states she understands will comply.  She is given a work note and discharged in stable condition.     As part of my medical decision making, I reviewed the following data within the [facility] notes reviewed and incorporated, Old chart reviewed, Radiograph reviewed chest x-ray is negative for pneumonia, Notes from prior ED visits and [place] Controlled Substance Database  ____________________________________________   FINAL CLINICAL IMPRESSION(S) / ED DIAGNOSES  Final diagnoses:  Acute URI      NEW MEDICATIONS STARTED DURING THIS VISIT:  New Prescriptions   AZITHROMYCIN (ZITHROMAX Z-PAK) 250 MG TABLET    2 pills today then 1 pill a day for 4 days   BENZONATATE (TESSALON) 200 MG CAPSULE    Take 1 capsule (200 mg total) by mouth 3 (three) times daily as needed for cough.     Note:  This document was  prepared using Systems analyst and may include unintentional dictation errors.     Versie Starks, PA-C 12/12/17 1054    Carrie Mew, MD 12/12/17 [phone]

## 2017-12-12 NOTE — ED Triage Notes (Addendum)
Pt arrived via POV with reports of getting the flu shot on Thursday, pt states the next day she got generalized body aches and coughing to the point she gags.  Pt states when she is laying down she feels drainage down the back of her throat. Pt also thinks she has a sinus infection.    Pt has mask on.

## 2017-12-12 NOTE — ED Notes (Signed)
See triage note  Presents with generalized body aches  States she is unsure of fever  Afebrile on arrival to ed  Occasional cough

## 2017-12-12 NOTE — Discharge Instructions (Signed)
Follow-up with your regular doctor if not better in 3 days.  Return emergency department worsening.  Take medications as prescribed.  Drink plenty of fluids as this always helps with cough and congestion.

## 2018-08-22 ENCOUNTER — Other Ambulatory Visit: Payer: Self-pay | Admitting: Nurse Practitioner

## 2018-08-22 DIAGNOSIS — Z1231 Encounter for screening mammogram for malignant neoplasm of breast: Secondary | ICD-10-CM

## 2018-12-18 ENCOUNTER — Ambulatory Visit
Admission: RE | Admit: 2018-12-18 | Discharge: 2018-12-18 | Disposition: A | Discharge status: Home / Self Care | Payer: BLUE CROSS/BLUE SHIELD | Source: Ambulatory Visit | Attending: Nurse Practitioner | Admitting: Nurse Practitioner

## 2018-12-18 DIAGNOSIS — Z1231 Encounter for screening mammogram for malignant neoplasm of breast: Secondary | ICD-10-CM | POA: Insufficient documentation

## 2019-11-16 ENCOUNTER — Other Ambulatory Visit: Admission: RE | Admit: 2019-11-16 | Payer: BLUE CROSS/BLUE SHIELD | Source: Ambulatory Visit

## 2019-11-23 ENCOUNTER — Other Ambulatory Visit: Payer: Self-pay | Admitting: Nurse Practitioner

## 2019-11-23 DIAGNOSIS — M545 Low back pain, unspecified: Secondary | ICD-10-CM

## 2019-11-23 DIAGNOSIS — M79605 Pain in left leg: Secondary | ICD-10-CM

## 2020-07-28 ENCOUNTER — Other Ambulatory Visit: Payer: Self-pay | Admitting: Nurse Practitioner

## 2020-07-28 DIAGNOSIS — H539 Unspecified visual disturbance: Secondary | ICD-10-CM

## 2020-07-28 DIAGNOSIS — R519 Headache, unspecified: Secondary | ICD-10-CM

## 2020-08-15 ENCOUNTER — Other Ambulatory Visit: Payer: Self-pay

## 2020-08-15 ENCOUNTER — Emergency Department
Admission: EM | Admit: 2020-08-15 | Discharge: 2020-08-15 | Disposition: A | Discharge status: Home / Self Care | Payer: BLUE CROSS/BLUE SHIELD | Attending: Emergency Medicine | Admitting: Emergency Medicine

## 2020-08-15 ENCOUNTER — Encounter: Payer: Self-pay | Admitting: Emergency Medicine

## 2020-08-15 DIAGNOSIS — I10 Essential (primary) hypertension: Secondary | ICD-10-CM | POA: Diagnosis not present

## 2020-08-15 DIAGNOSIS — T7840XA Allergy, unspecified, initial encounter: Secondary | ICD-10-CM | POA: Diagnosis not present

## 2020-08-15 DIAGNOSIS — W57XXXA Bitten or stung by nonvenomous insect and other nonvenomous arthropods, initial encounter: Secondary | ICD-10-CM | POA: Insufficient documentation

## 2020-08-15 DIAGNOSIS — Z79899 Other long term (current) drug therapy: Secondary | ICD-10-CM | POA: Diagnosis not present

## 2020-08-15 DIAGNOSIS — Z7982 Long term (current) use of aspirin: Secondary | ICD-10-CM | POA: Insufficient documentation

## 2020-08-15 DIAGNOSIS — R200 Anesthesia of skin: Secondary | ICD-10-CM | POA: Insufficient documentation

## 2020-08-15 DIAGNOSIS — F17210 Nicotine dependence, cigarettes, uncomplicated: Secondary | ICD-10-CM | POA: Diagnosis not present

## 2020-08-15 MED ORDER — METHYLPREDNISOLONE SODIUM SUCC 125 MG IJ SOLR
125.0000 mg | Freq: Once | INTRAMUSCULAR | Status: AC
  Administered 2020-08-15: 125 mg via INTRAVENOUS
  Filled 2020-08-15: qty 2

## 2020-08-15 MED ORDER — PREDNISONE 10 MG PO TABS: 20.0000 mg | ORAL_TABLET | Freq: Every day | ORAL | 0 refills | Status: DC

## 2020-08-15 MED ORDER — PREDNISONE 10 MG PO TABS: 20.0000 mg | ORAL_TABLET | Freq: Every day | ORAL | 0 refills | Status: AC

## 2020-08-15 MED ORDER — SODIUM CHLORIDE 0.9 % IV BOLUS
1000.0000 mL | Freq: Once | INTRAVENOUS | Status: AC
  Administered 2020-08-15: 1000 mL via INTRAVENOUS

## 2020-08-15 MED ORDER — FAMOTIDINE IN NACL 20-0.9 MG/50ML-% IV SOLN
20.0000 mg | Freq: Once | INTRAVENOUS | Status: AC
  Administered 2020-08-15: 20 mg via INTRAVENOUS
  Filled 2020-08-15: qty 50

## 2020-08-15 NOTE — ED Provider Notes (Signed)
[facility] Emergency Department Provider Note    ____________________________________________   I have reviewed the triage vital signs and the nursing notes.   HISTORY  Chief Complaint Allergic Reaction   History limited by: Not Limited   HPI Whitney Hart is a 50 y.o. female who presents to the emergency department today because of concern for allergic reaction. The patient states that she thinks she was stung or bitten by an insect roughly 2 hours prior to my evaluation. It occurred on her left thigh. Shortly thereafter she noticed some tingling to the area around the bite and her right arm. She then noticed some chest tightness and tongue swelling. Did take a benadryl and at the time of my exam feels slight improvement. The patient states she has many allergies and has had to use an epipen once in the past. She denies any recent illness.   Records reviewed. Per medical record review patient has a history of HTN.   Past Medical History:  Diagnosis Date   Hypertension     Patient Active Problem List   Diagnosis Date Noted   TIA (transient ischemic attack) 06/26/2016    History reviewed. No pertinent surgical history.  Prior to Admission medications   Medication Sig Start Date End Date Taking? Authorizing Provider  amLODipine (NORVASC) 10 MG tablet Take 10 mg by mouth daily. 04/18/14   [provider]  aspirin EC 325 MG EC tablet Take 1 tablet (325 mg total) by mouth daily. 06/27/16   Bettey Costa, MD  azithromycin (ZITHROMAX Z-PAK) 250 MG tablet 2 pills today then 1 pill a day for 4 days 12/12/17   Caryn Section Linden Dolin, PA-C  benzonatate (TESSALON) 200 MG capsule Take 1 capsule (200 mg total) by mouth 3 (three) times daily as needed for cough. 12/12/17   Fisher, Linden Dolin, PA-C  hydrochlorothiazide (HYDRODIURIL) 25 MG tablet Take 25 mg by mouth daily. 05/26/16   [provider]  meloxicam (MOBIC) 15 MG tablet Take 1 tablet (15 mg total) by  mouth daily. 03/14/17   Sable Feil, PA-C  metoprolol succinate (TOPROL-XL) 100 MG 24 hr tablet Take 100 mg by mouth daily. 06/02/16   [provider]  PROAIR HFA 108 (90 Base) MCG/ACT inhaler Inhale 2 puffs into the lungs every 4 (four) hours as needed. 06/18/15   [provider]  spironolactone (ALDACTONE) 25 MG tablet Take 25 mg by mouth daily. 05/26/16   [provider]  traMADol (ULTRAM) 50 MG tablet Take 50 mg by mouth 2 (two) times daily as needed. 06/02/16   [provider]  traMADol (ULTRAM) 50 MG tablet Take 1 tablet (50 mg total) by mouth every 6 (six) hours as needed for moderate pain. 03/14/17   Sable Feil, PA-C  triamcinolone cream (KENALOG) 0.5 % Apply 1 application topically daily as needed. 06/11/16   [provider]  Vitamin D, Ergocalciferol, (DRISDOL) 50000 units CAPS capsule Take 50,000 Units by mouth every 7 (seven) days.    [provider]    Allergies Lisinopril and Varenicline  Family History  Problem Relation Age of Onset   Breast cancer Neg Hx     Social History Social History   Tobacco Use   Smoking status: Every Day    Packs/day: 0.50    Pack years: 0.00    Types: Cigarettes   Smokeless tobacco: Never  Substance Use Topics   Alcohol use: Yes    Comment: occasionally   Drug use: No  Review of Systems Constitutional: No fever/chills Eyes: No visual changes. ENT: Positive for tongue swelling. Cardiovascular: Positive for chest tightness.  Respiratory: Denies shortness of breath. Gastrointestinal: No abdominal pain.  No nausea, no vomiting.  No diarrhea.   Genitourinary: Negative for dysuria. Musculoskeletal: Negative for back pain. Skin: Positive for tingling to left thigh and right arm. Neurological: Negative for headaches, focal weakness or numbness.  ____________________________________________   PHYSICAL EXAM:  VITAL SIGNS: ED Triage Vitals  Enc Vitals Group     BP 08/15/20 1053  (!) 166/101     Pulse Rate 08/15/20 1053 (!) 107     Resp 08/15/20 1053 (!) 22     Temp 08/15/20 1053 97.8 F (36.6 C)     Temp Source 08/15/20 1053 Oral     SpO2 08/15/20 1053 96 %     Weight 08/15/20 1054 208 lb 15.9 oz (94.8 kg)     Height 08/15/20 1054 5\' 6"  (1.676 m)     Head Circumference --      Peak Flow --      Pain Score 08/15/20 1054 0   Constitutional: Alert and oriented.  Eyes: Conjunctivae are normal.  ENT      Head: Normocephalic and atraumatic.      Nose: No congestion/rhinnorhea.      Mouth/Throat: Mucous membranes are moist. No tongue or oropharyngeal swelling.      Neck: No stridor. Hematological/Lymphatic/Immunilogical: No cervical lymphadenopathy. Cardiovascular: Normal rate, regular rhythm.  No murmurs, rubs, or gallops.  Respiratory: Normal respiratory effort without tachypnea nor retractions. Breath sounds are clear and equal bilaterally. No wheezes/rales/rhonchi. Gastrointestinal: Soft and non tender. No rebound. No guarding.  Genitourinary: Deferred Musculoskeletal: Normal range of motion in all extremities. No lower extremity edema. Neurologic:  Normal speech and language. No gross focal neurologic deficits are appreciated.  Skin:  Skin is warm, dry and intact. No rash noted. No hives Psychiatric: Mood and affect are normal. Speech and behavior are normal. Patient exhibits appropriate insight and judgment.  ____________________________________________    LABS (pertinent positives/negatives)  None  ____________________________________________   EKG  None  ____________________________________________    RADIOLOGY  None  ____________________________________________   PROCEDURES  Procedures  ____________________________________________   INITIAL IMPRESSION / ASSESSMENT AND PLAN / ED COURSE  Pertinent labs & imaging results that were available during my care of the patient were reviewed by me and considered in my medical decision  making (see chart for details).   Patient presented to the emergency department today because of concern for allergic reaction to an insect bite/sting. On exam patient without any obvious swelling, respiratory distress or hives. At that time did not feel epi was necessary. Patient was given steroids and pepcid. Did feel improvement after medication. Will plan on discharging with prescription for steroids. Patient states that she does have epi pens.   ____________________________________________   FINAL CLINICAL IMPRESSION(S) / ED DIAGNOSES  Final diagnoses:  Allergic reaction, initial encounter     Note: This dictation was prepared with Dragon dictation. Any transcriptional errors that result from this process are unintentional     Nance Pear, MD 08/15/20 [phone]

## 2020-08-15 NOTE — Discharge Instructions (Addendum)
Please seek medical attention for any high fevers, chest pain, shortness of breath, change in behavior, persistent vomiting, bloody stool or any other new or concerning symptoms.  

## 2020-08-15 NOTE — ED Triage Notes (Signed)
C?o being bitten or stung by a spider just PTA.  C/O tongue swelling, lips tingling, generalized tingling to body.    Voice clear and strong.  No swelling appreciated to lips and tongue.  No SOB/ DOE.  Anxious.  Skin warm and dry. Patient states "Im allergic to a little bit of everything".  States Mother just died yesterday.

## 2021-01-20 ENCOUNTER — Other Ambulatory Visit: Payer: Self-pay

## 2021-01-20 ENCOUNTER — Encounter: Payer: Self-pay | Admitting: Emergency Medicine

## 2021-01-20 DIAGNOSIS — T22031A Burn of unspecified degree of right upper arm, initial encounter: Secondary | ICD-10-CM | POA: Diagnosis not present

## 2021-01-20 DIAGNOSIS — Z5321 Procedure and treatment not carried out due to patient leaving prior to being seen by health care provider: Secondary | ICD-10-CM | POA: Diagnosis not present

## 2021-01-20 DIAGNOSIS — T2101XA Burn of unspecified degree of chest wall, initial encounter: Secondary | ICD-10-CM | POA: Insufficient documentation

## 2021-01-20 DIAGNOSIS — T2000XA Burn of unspecified degree of head, face, and neck, unspecified site, initial encounter: Secondary | ICD-10-CM | POA: Diagnosis not present

## 2021-01-20 DIAGNOSIS — X158XXA Contact with other hot household appliances, initial encounter: Secondary | ICD-10-CM | POA: Diagnosis not present
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
## 2021-01-21 ENCOUNTER — Emergency Department
Admission: EM | Admit: 2021-01-21 | Discharge: 2021-01-21 | Disposition: A | Discharge status: Home / Self Care | Payer: BLUE CROSS/BLUE SHIELD | Attending: Emergency Medicine | Admitting: Emergency Medicine

## 2021-03-02 DIAGNOSIS — M79661 Pain in right lower leg: Secondary | ICD-10-CM | POA: Diagnosis not present

## 2021-03-02 DIAGNOSIS — R609 Edema, unspecified: Secondary | ICD-10-CM | POA: Diagnosis not present

## 2021-03-02 DIAGNOSIS — M79604 Pain in right leg: Secondary | ICD-10-CM | POA: Diagnosis not present

## 2021-03-02 DIAGNOSIS — R69 Illness, unspecified: Secondary | ICD-10-CM | POA: Diagnosis not present

## 2021-03-02 DIAGNOSIS — M25561 Pain in right knee: Secondary | ICD-10-CM | POA: Diagnosis not present

## 2021-03-03 ENCOUNTER — Ambulatory Visit: Payer: 59

## 2021-03-03 ENCOUNTER — Other Ambulatory Visit: Payer: Self-pay | Admitting: Nurse Practitioner

## 2021-03-03 DIAGNOSIS — M79604 Pain in right leg: Secondary | ICD-10-CM

## 2021-03-03 DIAGNOSIS — M25561 Pain in right knee: Secondary | ICD-10-CM | POA: Diagnosis not present

## 2021-03-04 ENCOUNTER — Ambulatory Visit
Admission: RE | Admit: 2021-03-04 | Discharge: 2021-03-04 | Disposition: A | Discharge status: Home / Self Care | Payer: 59 | Source: Ambulatory Visit | Attending: Nurse Practitioner | Admitting: Nurse Practitioner

## 2021-03-04 ENCOUNTER — Other Ambulatory Visit: Payer: Self-pay

## 2021-03-04 DIAGNOSIS — M79604 Pain in right leg: Secondary | ICD-10-CM | POA: Diagnosis not present

## 2021-03-04 DIAGNOSIS — M7989 Other specified soft tissue disorders: Secondary | ICD-10-CM | POA: Diagnosis not present

## 2021-04-15 DIAGNOSIS — R7303 Prediabetes: Secondary | ICD-10-CM | POA: Diagnosis not present

## 2021-04-15 DIAGNOSIS — E785 Hyperlipidemia, unspecified: Secondary | ICD-10-CM | POA: Diagnosis not present

## 2021-04-15 DIAGNOSIS — I10 Essential (primary) hypertension: Secondary | ICD-10-CM | POA: Diagnosis not present

## 2021-04-22 ENCOUNTER — Other Ambulatory Visit: Payer: Self-pay | Admitting: Nurse Practitioner

## 2021-04-22 DIAGNOSIS — Z1231 Encounter for screening mammogram for malignant neoplasm of breast: Secondary | ICD-10-CM

## 2021-04-22 DIAGNOSIS — E785 Hyperlipidemia, unspecified: Secondary | ICD-10-CM | POA: Diagnosis not present

## 2021-04-22 DIAGNOSIS — M1990 Unspecified osteoarthritis, unspecified site: Secondary | ICD-10-CM | POA: Diagnosis not present

## 2021-04-22 DIAGNOSIS — E1165 Type 2 diabetes mellitus with hyperglycemia: Secondary | ICD-10-CM | POA: Diagnosis not present

## 2021-04-22 DIAGNOSIS — I10 Essential (primary) hypertension: Secondary | ICD-10-CM | POA: Diagnosis not present

## 2021-05-12 DIAGNOSIS — E785 Hyperlipidemia, unspecified: Secondary | ICD-10-CM | POA: Diagnosis not present

## 2021-05-12 DIAGNOSIS — E1165 Type 2 diabetes mellitus with hyperglycemia: Secondary | ICD-10-CM | POA: Diagnosis not present

## 2021-05-12 DIAGNOSIS — J309 Allergic rhinitis, unspecified: Secondary | ICD-10-CM | POA: Diagnosis not present

## 2021-05-12 DIAGNOSIS — I10 Essential (primary) hypertension: Secondary | ICD-10-CM | POA: Diagnosis not present

## 2021-06-01 ENCOUNTER — Ambulatory Visit
Admission: RE | Admit: 2021-06-01 | Discharge: 2021-06-01 | Disposition: A | Discharge status: Home / Self Care | Payer: 59 | Source: Ambulatory Visit | Attending: Nurse Practitioner | Admitting: Nurse Practitioner

## 2021-06-01 DIAGNOSIS — Z1231 Encounter for screening mammogram for malignant neoplasm of breast: Secondary | ICD-10-CM | POA: Insufficient documentation

## 2021-08-19 DIAGNOSIS — I10 Essential (primary) hypertension: Secondary | ICD-10-CM | POA: Diagnosis not present

## 2021-08-19 DIAGNOSIS — E1165 Type 2 diabetes mellitus with hyperglycemia: Secondary | ICD-10-CM | POA: Diagnosis not present

## 2021-08-19 DIAGNOSIS — E785 Hyperlipidemia, unspecified: Secondary | ICD-10-CM | POA: Diagnosis not present

## 2021-08-26 DIAGNOSIS — R7303 Prediabetes: Secondary | ICD-10-CM | POA: Diagnosis not present

## 2021-08-26 DIAGNOSIS — I10 Essential (primary) hypertension: Secondary | ICD-10-CM | POA: Diagnosis not present

## 2021-08-26 DIAGNOSIS — E785 Hyperlipidemia, unspecified: Secondary | ICD-10-CM | POA: Diagnosis not present

## 2021-08-26 DIAGNOSIS — E1165 Type 2 diabetes mellitus with hyperglycemia: Secondary | ICD-10-CM | POA: Diagnosis not present

## 2021-08-26 DIAGNOSIS — R69 Illness, unspecified: Secondary | ICD-10-CM | POA: Diagnosis not present

## 2021-10-07 DIAGNOSIS — Z79899 Other long term (current) drug therapy: Secondary | ICD-10-CM | POA: Diagnosis not present

## 2021-11-26 DIAGNOSIS — E1165 Type 2 diabetes mellitus with hyperglycemia: Secondary | ICD-10-CM | POA: Diagnosis not present

## 2021-11-26 DIAGNOSIS — E785 Hyperlipidemia, unspecified: Secondary | ICD-10-CM | POA: Diagnosis not present

## 2021-11-26 DIAGNOSIS — I10 Essential (primary) hypertension: Secondary | ICD-10-CM | POA: Diagnosis not present

## 2021-12-02 DIAGNOSIS — I10 Essential (primary) hypertension: Secondary | ICD-10-CM | POA: Diagnosis not present

## 2021-12-02 DIAGNOSIS — R7303 Prediabetes: Secondary | ICD-10-CM | POA: Diagnosis not present

## 2021-12-02 DIAGNOSIS — E785 Hyperlipidemia, unspecified: Secondary | ICD-10-CM | POA: Diagnosis not present

## 2021-12-02 DIAGNOSIS — M25561 Pain in right knee: Secondary | ICD-10-CM | POA: Diagnosis not present

## 2021-12-16 DIAGNOSIS — I10 Essential (primary) hypertension: Secondary | ICD-10-CM | POA: Diagnosis not present

## 2022-03-18 ENCOUNTER — Encounter: Payer: Self-pay | Admitting: Nurse Practitioner

## 2022-03-23 ENCOUNTER — Other Ambulatory Visit: Payer: Self-pay | Admitting: Nurse Practitioner

## 2022-03-23 ENCOUNTER — Other Ambulatory Visit: Payer: 59

## 2022-03-23 DIAGNOSIS — I10 Essential (primary) hypertension: Secondary | ICD-10-CM | POA: Diagnosis not present

## 2022-03-23 DIAGNOSIS — R7303 Prediabetes: Secondary | ICD-10-CM | POA: Diagnosis not present

## 2022-03-23 DIAGNOSIS — E785 Hyperlipidemia, unspecified: Secondary | ICD-10-CM | POA: Diagnosis not present

## 2022-03-24 ENCOUNTER — Ambulatory Visit (INDEPENDENT_AMBULATORY_CARE_PROVIDER_SITE_OTHER): Payer: 59 | Admitting: Nurse Practitioner

## 2022-03-24 VITALS — BP 122/74 | HR 84 | Ht 66.0 in | Wt 225.0 lb

## 2022-03-24 DIAGNOSIS — Z1272 Encounter for screening for malignant neoplasm of vagina: Secondary | ICD-10-CM

## 2022-03-24 DIAGNOSIS — E119 Type 2 diabetes mellitus without complications: Secondary | ICD-10-CM

## 2022-03-24 DIAGNOSIS — I10 Essential (primary) hypertension: Secondary | ICD-10-CM | POA: Diagnosis not present

## 2022-03-24 DIAGNOSIS — Z0001 Encounter for general adult medical examination with abnormal findings: Secondary | ICD-10-CM

## 2022-03-24 DIAGNOSIS — E782 Mixed hyperlipidemia: Secondary | ICD-10-CM | POA: Diagnosis not present

## 2022-03-24 DIAGNOSIS — Z0000 Encounter for general adult medical examination without abnormal findings: Secondary | ICD-10-CM

## 2022-03-24 LAB — COMPREHENSIVE METABOLIC PANEL
ALT: 14 IU/L (ref 0–32)
AST: 18 IU/L (ref 0–40)
Albumin/Globulin Ratio: 1.9 (ref 1.2–2.2)
Albumin: 4.2 g/dL (ref 3.8–4.9)
Alkaline Phosphatase: 84 IU/L (ref 44–121)
BUN/Creatinine Ratio: 14 (ref 9–23)
BUN: 8 mg/dL (ref 6–24)
Bilirubin Total: 0.4 mg/dL (ref 0.0–1.2)
CO2: 22 mmol/L (ref 20–29)
Calcium: 9.4 mg/dL (ref 8.7–10.2)
Chloride: 106 mmol/L (ref 96–106)
Creatinine, Ser: 0.58 mg/dL (ref 0.57–1.00)
Globulin, Total: 2.2 g/dL (ref 1.5–4.5)
Glucose: 126 mg/dL — ABNORMAL HIGH (ref 70–99)
Potassium: 3.7 mmol/L (ref 3.5–5.2)
Sodium: 142 mmol/L (ref 134–144)
Total Protein: 6.4 g/dL (ref 6.0–8.5)
eGFR: 109 mL/min/{1.73_m2} (ref >59)

## 2022-03-24 LAB — LIPID PANEL W/O CHOL/HDL RATIO
Cholesterol, Total: 157 mg/dL (ref 100–199)
HDL: 40 mg/dL (ref >39)
LDL Chol Calc (NIH): 102 mg/dL — ABNORMAL HIGH (ref 0–99)
Triglycerides: 79 mg/dL (ref 0–149)
VLDL Cholesterol Cal: 15 mg/dL (ref 5–40)

## 2022-03-24 LAB — TSH: TSH: 1.65 u[IU]/mL (ref 0.450–4.500)

## 2022-03-24 LAB — HGB A1C W/O EAG: Hgb A1c MFr Bld: 6.6 % — ABNORMAL HIGH (ref 4.8–5.6)

## 2022-03-24 MED ORDER — AMLODIPINE BESYLATE 2.5 MG PO TABS: 2.5000 mg | ORAL_TABLET | Freq: Every day | ORAL | 1 refills | Status: AC

## 2022-03-24 MED ORDER — GLIPIZIDE 10 MG PO TABS: 10.0000 mg | ORAL_TABLET | Freq: Every day | ORAL | 1 refills | Status: AC

## 2022-03-24 NOTE — Progress Notes (Signed)
Established Patient Office Visit  Subjective:  Patient ID: Whitney Hart, female    DOB: 1970/05/22  Age: 52 y.o. MRN: [id number]  Chief Complaint  Patient presents with   Annual Exam    CPE W PAP    CPE with Pap and review of recent fasting labs, A1c is higher at 6.6%.  Not currently on any DMII meds.  Has had GI intolerance with metformin.     Past Medical History:  Diagnosis Date   Hypertension     Social History   Socioeconomic History   Marital status: Single    Spouse name: Not on file   Number of children: Not on file   Years of education: Not on file   Highest education level: Not on file  Occupational History   Not on file  Tobacco Use   Smoking status: Every Day    Packs/day: 0.50    Types: Cigarettes   Smokeless tobacco: Never  Substance and Sexual Activity   Alcohol use: Yes    Comment: occasionally   Drug use: No   Sexual activity: Not on file  Other Topics Concern   Not on file  Social History Narrative   Not on file   Social Determinants of Health   Financial Resource Strain: Not on file  Food Insecurity: Not on file  Transportation Needs: Not on file  Physical Activity: Not on file  Stress: Not on file  Social Connections: Not on file  Intimate Partner Violence: Not on file    Family History  Problem Relation Age of Onset   Breast cancer Neg Hx     Allergies  Allergen Reactions   Lisinopril Swelling   Varenicline Swelling    Review of Systems  All other systems reviewed and are negative.      Objective:   BP 122/74   Pulse 84   Ht 5' 6"$  (1.676 m)   Wt 225 lb (102.1 kg)   LMP 08/12/2015   SpO2 100%   BMI 36.32 kg/m   Vitals:   03/24/22 1007  BP: 122/74  Pulse: 84  Height: 5' 6"$  (1.676 m)  Weight: 225 lb (102.1 kg)  SpO2: 100%  BMI (Calculated): 36.33    Physical Exam Constitutional:      Appearance: Normal appearance.  HENT:     Head: Normocephalic.     Nose: Nose normal.     Mouth/Throat:      Mouth: Mucous membranes are moist.  Eyes:     Pupils: Pupils are equal, round, and reactive to light.  Cardiovascular:     Rate and Rhythm: Normal rate and regular rhythm.  Pulmonary:     Effort: Pulmonary effort is normal.     Breath sounds: Normal breath sounds.  Abdominal:     General: Bowel sounds are normal.     Palpations: Abdomen is soft.  Genitourinary:    General: Normal vulva.  Musculoskeletal:        General: Normal range of motion.     Cervical back: Neck supple.  Skin:    General: Skin is warm and dry.  Neurological:     Mental Status: She is alert and oriented to person, place, and time.  Psychiatric:        Mood and Affect: Mood normal.        Behavior: Behavior normal.      No results found for any visits on 03/24/22.  Recent Results (from the past 2160 hour(s))  Comprehensive metabolic panel  Status: Abnormal   Collection Time: 03/23/22  8:40 AM  Result Value Ref Range   Glucose 126 (H) 70 - 99 mg/dL   BUN 8 6 - 24 mg/dL   Creatinine, Ser 0.58 0.57 - 1.00 mg/dL   eGFR 109 >59 mL/min/1.73   BUN/Creatinine Ratio 14 9 - 23   Sodium 142 134 - 144 mmol/L   Potassium 3.7 3.5 - 5.2 mmol/L   Chloride 106 96 - 106 mmol/L   CO2 22 20 - 29 mmol/L   Calcium 9.4 8.7 - 10.2 mg/dL   Total Protein 6.4 6.0 - 8.5 g/dL   Albumin 4.2 3.8 - 4.9 g/dL   Globulin, Total 2.2 1.5 - 4.5 g/dL   Albumin/Globulin Ratio 1.9 1.2 - 2.2   Bilirubin Total 0.4 0.0 - 1.2 mg/dL   Alkaline Phosphatase 84 44 - 121 IU/L   AST 18 0 - 40 IU/L   ALT 14 0 - 32 IU/L  Lipid Panel w/o Chol/HDL Ratio     Status: Abnormal   Collection Time: 03/23/22  8:40 AM  Result Value Ref Range   Cholesterol, Total 157 100 - 199 mg/dL   Triglycerides 79 0 - 149 mg/dL   HDL 40 >39 mg/dL   VLDL Cholesterol Cal 15 5 - 40 mg/dL   LDL Chol Calc (NIH) 102 (H) 0 - 99 mg/dL  Hgb A1c w/o eAG     Status: Abnormal   Collection Time: 03/23/22  8:40 AM  Result Value Ref Range   Hgb A1c MFr Bld 6.6 (H) 4.8 -  5.6 %    Comment:          Prediabetes: 5.7 - 6.4          Diabetes: >6.4          Glycemic control for adults with diabetes: <7.0   TSH     Status: None   Collection Time: 03/23/22  8:40 AM  Result Value Ref Range   TSH 1.650 0.450 - 4.500 uIU/mL      Assessment & Plan:   Problem List Items Addressed This Visit       Cardiovascular and Mediastinum   Essential hypertension, benign     Endocrine   Diabetes mellitus without complication (HCC)   Relevant Medications   glipiZIDE (GLUCOTROL) 10 MG tablet     Other   Vaginal Pap smear - Primary   Relevant Orders   Pap IG (Image Guided)   Mixed hyperlipidemia    No follow-ups on file.   Total time spent: 30 minutes  Evern Bio, NP  03/24/2022

## 2022-03-24 NOTE — Addendum Note (Signed)
Addended by: Evern Bio on: 03/24/2022 10:50 AM   Modules accepted: Orders

## 2022-03-29 LAB — PAP IG (IMAGE GUIDED): PAP Smear Comment: 0

## 2022-03-29 LAB — SPECIMEN STATUS REPORT

## 2022-04-08 ENCOUNTER — Telehealth: Payer: Self-pay

## 2022-04-08 NOTE — Telephone Encounter (Signed)
Pt LM asking for call back about her PAP results

## 2022-04-12 ENCOUNTER — Other Ambulatory Visit: Payer: Self-pay

## 2022-04-19 ENCOUNTER — Other Ambulatory Visit: Payer: Self-pay | Admitting: Nurse Practitioner

## 2022-06-10 ENCOUNTER — Other Ambulatory Visit: Payer: Self-pay | Admitting: Anesthesiology

## 2022-06-10 DIAGNOSIS — M25511 Pain in right shoulder: Secondary | ICD-10-CM

## 2022-06-10 DIAGNOSIS — M5440 Lumbago with sciatica, unspecified side: Secondary | ICD-10-CM

## 2022-06-10 DIAGNOSIS — M545 Low back pain, unspecified: Secondary | ICD-10-CM

## 2022-06-10 DIAGNOSIS — G8929 Other chronic pain: Secondary | ICD-10-CM

## 2022-06-21 ENCOUNTER — Other Ambulatory Visit: Payer: 59

## 2022-06-24 ENCOUNTER — Other Ambulatory Visit: Payer: 59

## 2022-08-18 ENCOUNTER — Other Ambulatory Visit: Payer: Self-pay | Admitting: Internal Medicine

## 2022-08-18 DIAGNOSIS — M62838 Other muscle spasm: Secondary | ICD-10-CM | POA: Diagnosis not present

## 2022-08-18 DIAGNOSIS — F17210 Nicotine dependence, cigarettes, uncomplicated: Secondary | ICD-10-CM | POA: Diagnosis not present

## 2022-08-18 DIAGNOSIS — G8929 Other chronic pain: Secondary | ICD-10-CM

## 2022-08-18 DIAGNOSIS — M545 Low back pain, unspecified: Secondary | ICD-10-CM | POA: Diagnosis not present

## 2022-08-18 DIAGNOSIS — M25519 Pain in unspecified shoulder: Secondary | ICD-10-CM | POA: Diagnosis not present

## 2022-08-18 DIAGNOSIS — Z79891 Long term (current) use of opiate analgesic: Secondary | ICD-10-CM | POA: Diagnosis not present

## 2022-08-18 DIAGNOSIS — I10 Essential (primary) hypertension: Secondary | ICD-10-CM | POA: Diagnosis not present

## 2022-08-18 DIAGNOSIS — M5440 Lumbago with sciatica, unspecified side: Secondary | ICD-10-CM | POA: Diagnosis not present

## 2022-08-18 DIAGNOSIS — M25561 Pain in right knee: Secondary | ICD-10-CM | POA: Diagnosis not present

## 2022-08-18 DIAGNOSIS — F1120 Opioid dependence, uncomplicated: Secondary | ICD-10-CM | POA: Diagnosis not present

## 2022-08-25 DIAGNOSIS — Z1330 Encounter for screening examination for mental health and behavioral disorders, unspecified: Secondary | ICD-10-CM | POA: Diagnosis not present

## 2022-08-25 DIAGNOSIS — Z122 Encounter for screening for malignant neoplasm of respiratory organs: Secondary | ICD-10-CM | POA: Diagnosis not present

## 2022-08-25 DIAGNOSIS — Z139 Encounter for screening, unspecified: Secondary | ICD-10-CM | POA: Diagnosis not present

## 2022-08-25 DIAGNOSIS — Z1331 Encounter for screening for depression: Secondary | ICD-10-CM | POA: Diagnosis not present

## 2022-08-25 DIAGNOSIS — I10 Essential (primary) hypertension: Secondary | ICD-10-CM | POA: Diagnosis not present

## 2022-08-25 DIAGNOSIS — F17200 Nicotine dependence, unspecified, uncomplicated: Secondary | ICD-10-CM | POA: Diagnosis not present

## 2022-08-25 DIAGNOSIS — E7849 Other hyperlipidemia: Secondary | ICD-10-CM | POA: Diagnosis not present

## 2022-08-25 DIAGNOSIS — E669 Obesity, unspecified: Secondary | ICD-10-CM | POA: Diagnosis not present

## 2022-08-25 DIAGNOSIS — E119 Type 2 diabetes mellitus without complications: Secondary | ICD-10-CM | POA: Diagnosis not present

## 2022-08-25 DIAGNOSIS — F17210 Nicotine dependence, cigarettes, uncomplicated: Secondary | ICD-10-CM | POA: Diagnosis not present

## 2022-09-20 IMAGING — MG MM DIGITAL SCREENING BILAT W/ TOMO AND CAD
8 series · 8 of 24 positions shown · non-contrast
Comparison: Previous exam(s).

CLINICAL DATA: Screening.

EXAM:
DIGITAL SCREENING BILATERAL MAMMOGRAM WITH TOMOSYNTHESIS AND CAD
TECHNIQUE: Bilateral screening digital craniocaudal and mediolateral oblique
mammograms were obtained. Bilateral screening digital breast
tomosynthesis was performed. The images were evaluated with
computer-aided detection.

[R MLO synth-2D]
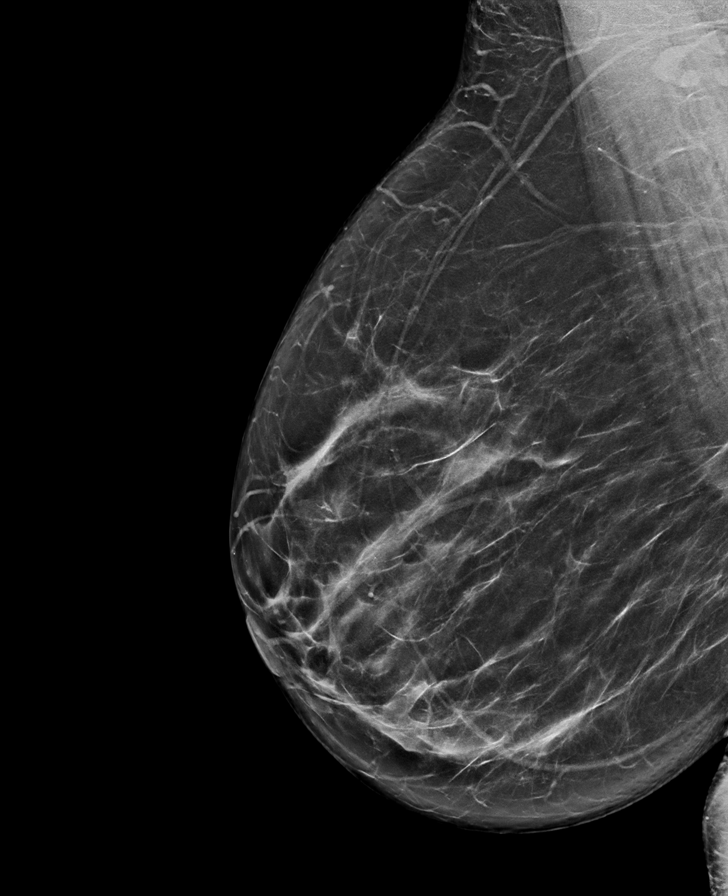

[R CC synth-2D]
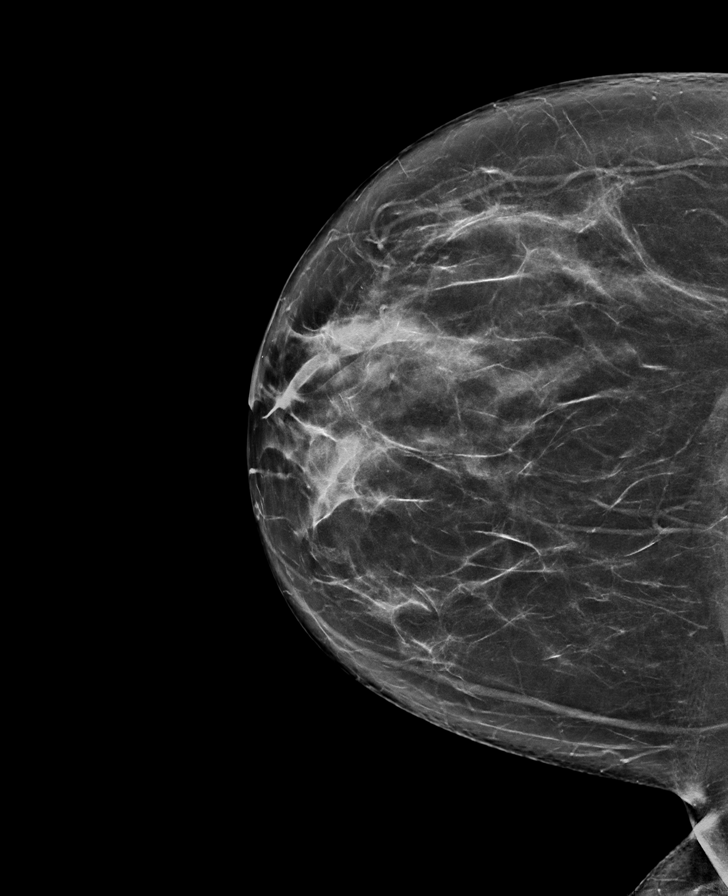

[L CC synth-2D]
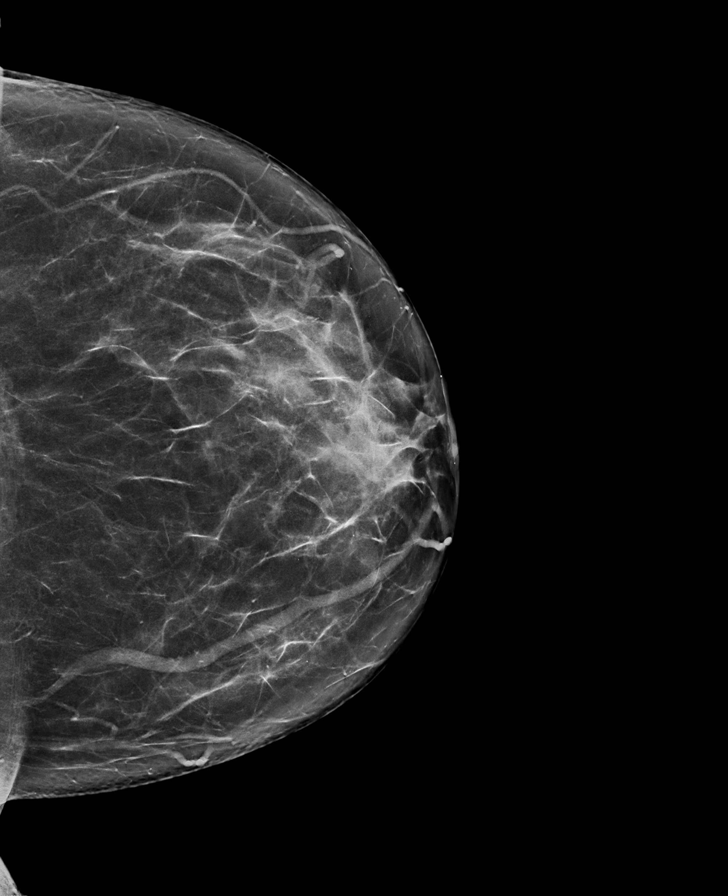

[L MLO synth-2D]
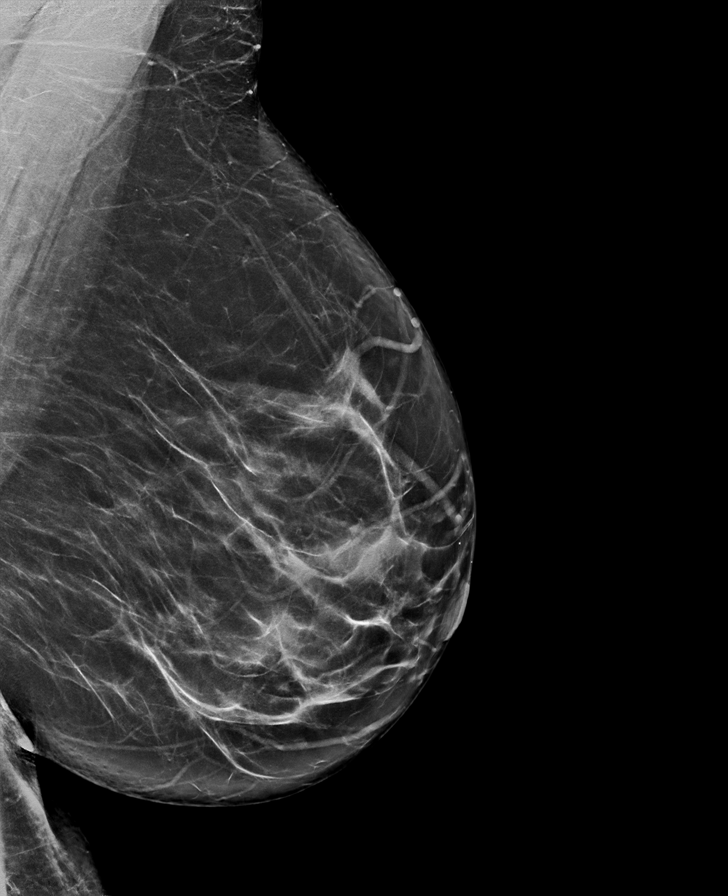

[R CC tomo · tomo slice 41/80.0]
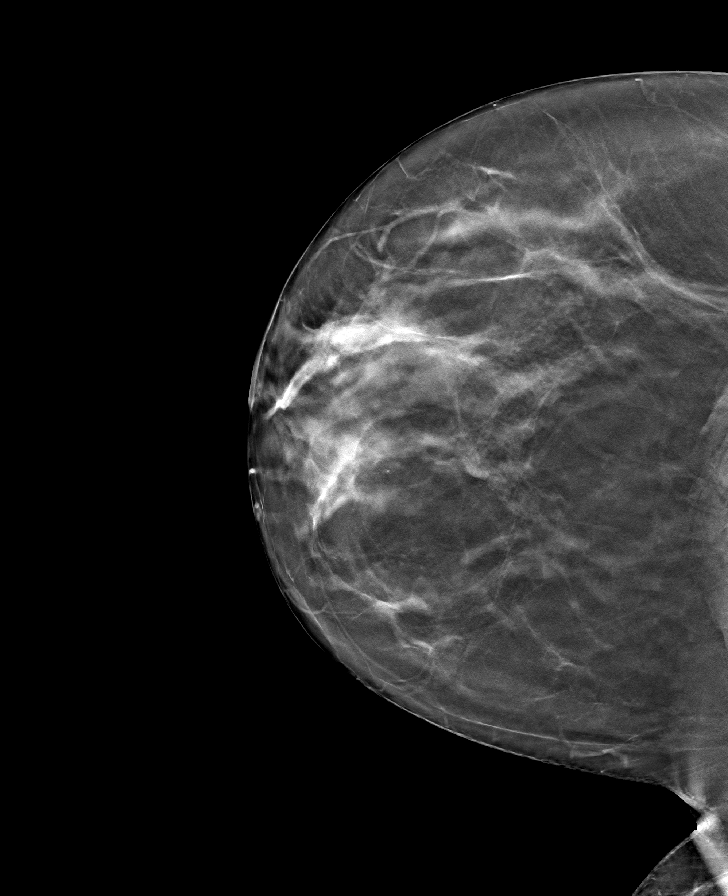

[L CC tomo · tomo slice 41/80.0]
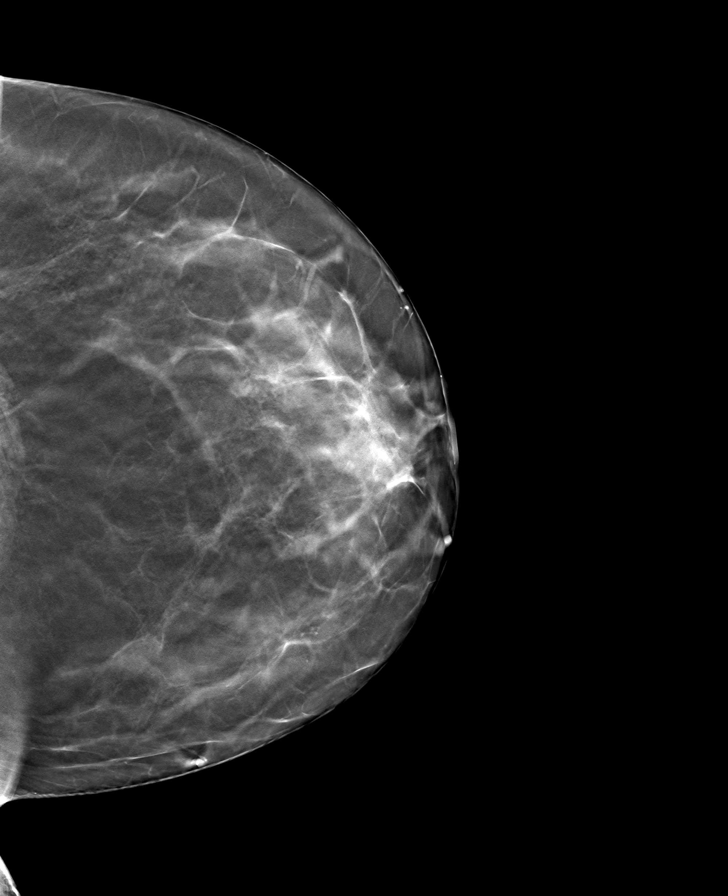

[R MLO tomo · tomo slice 42/83.0]
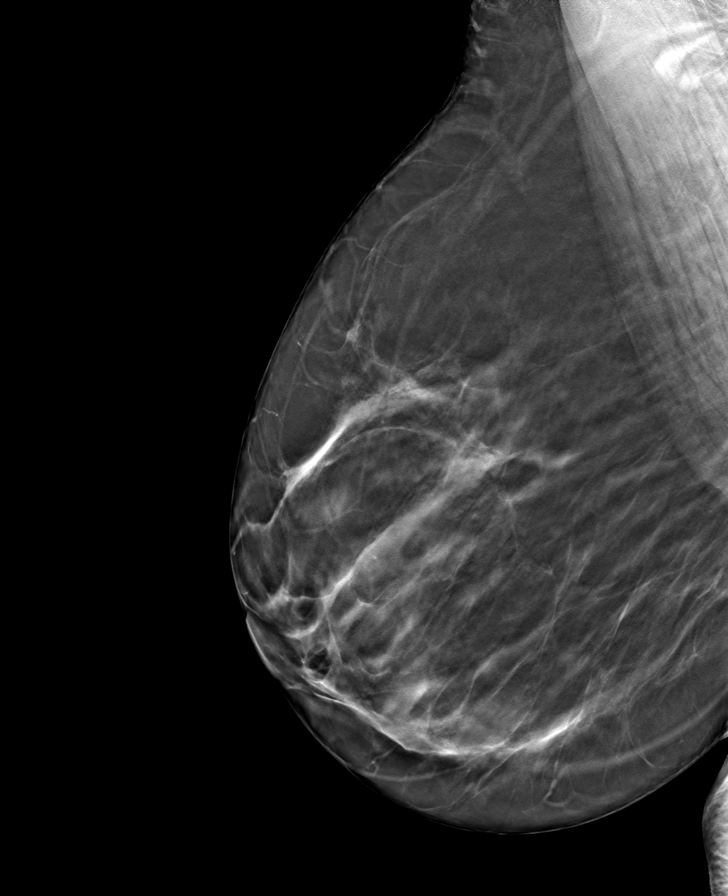

[L MLO tomo · tomo slice 43/84.0]
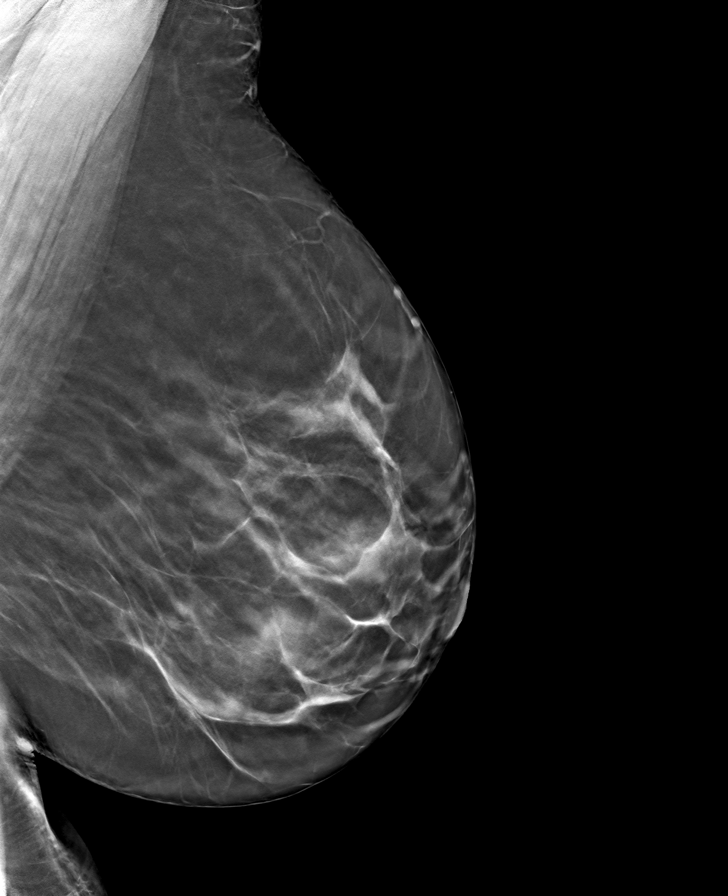

[8 of 24 positions shown; findings below may reference images not displayed]

ACR Breast Density Category c: The breast tissue is heterogeneously
dense, which may obscure small masses.
FINDINGS: There are no findings suspicious for malignancy.
IMPRESSION: No mammographic evidence of malignancy. A result letter of this
screening mammogram will be mailed directly to the patient.

RECOMMENDATION:
Screening mammogram in one year. (Code:Q3-W-BC3)

BI-RADS CATEGORY  1: Negative.

## 2022-12-20 DIAGNOSIS — R809 Proteinuria, unspecified: Secondary | ICD-10-CM | POA: Diagnosis not present

## 2022-12-20 DIAGNOSIS — E1129 Type 2 diabetes mellitus with other diabetic kidney complication: Secondary | ICD-10-CM | POA: Diagnosis not present

## 2022-12-20 DIAGNOSIS — I10 Essential (primary) hypertension: Secondary | ICD-10-CM | POA: Diagnosis not present

## 2023-01-17 ENCOUNTER — Other Ambulatory Visit: Payer: Self-pay

## 2023-01-20 ENCOUNTER — Other Ambulatory Visit: Payer: Self-pay | Admitting: Cardiology

## 2023-03-03 DIAGNOSIS — R079 Chest pain, unspecified: Secondary | ICD-10-CM | POA: Diagnosis not present

## 2023-03-03 DIAGNOSIS — R0602 Shortness of breath: Secondary | ICD-10-CM | POA: Diagnosis not present

## 2023-04-14 ENCOUNTER — Other Ambulatory Visit: Payer: Self-pay | Admitting: Anesthesiology

## 2023-04-14 DIAGNOSIS — M5416 Radiculopathy, lumbar region: Secondary | ICD-10-CM

## 2023-04-14 DIAGNOSIS — M5440 Lumbago with sciatica, unspecified side: Secondary | ICD-10-CM

## 2023-04-14 DIAGNOSIS — M545 Low back pain, unspecified: Secondary | ICD-10-CM

## 2023-04-14 DIAGNOSIS — G8929 Other chronic pain: Secondary | ICD-10-CM

## 2023-04-27 ENCOUNTER — Encounter: Payer: Self-pay | Admitting: Anesthesiology

## 2023-04-30 ENCOUNTER — Other Ambulatory Visit

## 2023-05-15 ENCOUNTER — Emergency Department

## 2023-05-15 ENCOUNTER — Other Ambulatory Visit: Payer: Self-pay

## 2023-05-15 ENCOUNTER — Emergency Department
Admission: EM | Admit: 2023-05-15 | Discharge: 2023-05-15 | Disposition: A | Discharge status: Home / Self Care | Attending: Emergency Medicine | Admitting: Emergency Medicine

## 2023-05-15 DIAGNOSIS — M546 Pain in thoracic spine: Secondary | ICD-10-CM | POA: Insufficient documentation

## 2023-05-15 DIAGNOSIS — M545 Low back pain, unspecified: Secondary | ICD-10-CM | POA: Insufficient documentation

## 2023-05-15 DIAGNOSIS — M5136 Other intervertebral disc degeneration, lumbar region with discogenic back pain only: Secondary | ICD-10-CM | POA: Diagnosis not present

## 2023-05-15 DIAGNOSIS — M25561 Pain in right knee: Secondary | ICD-10-CM | POA: Insufficient documentation

## 2023-05-15 DIAGNOSIS — Y92410 Unspecified street and highway as the place of occurrence of the external cause: Secondary | ICD-10-CM | POA: Insufficient documentation

## 2023-05-15 DIAGNOSIS — M48061 Spinal stenosis, lumbar region without neurogenic claudication: Secondary | ICD-10-CM | POA: Diagnosis not present

## 2023-05-15 DIAGNOSIS — I10 Essential (primary) hypertension: Secondary | ICD-10-CM | POA: Diagnosis not present

## 2023-05-15 DIAGNOSIS — Z041 Encounter for examination and observation following transport accident: Secondary | ICD-10-CM | POA: Diagnosis not present

## 2023-05-15 DIAGNOSIS — M549 Dorsalgia, unspecified: Secondary | ICD-10-CM

## 2023-05-15 MED ORDER — METHOCARBAMOL 500 MG PO TABS: 1000.0000 mg | ORAL_TABLET | Freq: Three times a day (TID) | ORAL | 0 refills | Status: AC

## 2023-05-15 NOTE — Discharge Instructions (Signed)
 You can take 650 mg of Tylenol and 600 mg of ibuprofen every 6 hours as needed for pain.  You can use ice, heat and muscle creams as needed.  I have sent a muscle relaxer that can be taken every 8 hours.  This medication will make you sleepy so do not drive after taking it.
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
## 2023-07-02 DIAGNOSIS — Z008 Encounter for other general examination: Secondary | ICD-10-CM | POA: Diagnosis not present

## 2023-07-02 DIAGNOSIS — E1122 Type 2 diabetes mellitus with diabetic chronic kidney disease: Secondary | ICD-10-CM | POA: Diagnosis not present

## 2023-07-02 DIAGNOSIS — Z833 Family history of diabetes mellitus: Secondary | ICD-10-CM | POA: Diagnosis not present

## 2023-07-02 DIAGNOSIS — M1990 Unspecified osteoarthritis, unspecified site: Secondary | ICD-10-CM | POA: Diagnosis not present

## 2023-07-02 DIAGNOSIS — Z8249 Family history of ischemic heart disease and other diseases of the circulatory system: Secondary | ICD-10-CM | POA: Diagnosis not present

## 2023-07-02 DIAGNOSIS — Z8673 Personal history of transient ischemic attack (TIA), and cerebral infarction without residual deficits: Secondary | ICD-10-CM | POA: Diagnosis not present

## 2023-07-02 DIAGNOSIS — N189 Chronic kidney disease, unspecified: Secondary | ICD-10-CM | POA: Diagnosis not present

## 2023-07-02 DIAGNOSIS — M545 Low back pain, unspecified: Secondary | ICD-10-CM | POA: Diagnosis not present

## 2023-07-02 DIAGNOSIS — Z809 Family history of malignant neoplasm, unspecified: Secondary | ICD-10-CM | POA: Diagnosis not present

## 2023-07-02 DIAGNOSIS — Z6834 Body mass index (BMI) 34.0-34.9, adult: Secondary | ICD-10-CM | POA: Diagnosis not present

## 2023-07-02 DIAGNOSIS — F17210 Nicotine dependence, cigarettes, uncomplicated: Secondary | ICD-10-CM | POA: Diagnosis not present

## 2023-07-02 DIAGNOSIS — E785 Hyperlipidemia, unspecified: Secondary | ICD-10-CM | POA: Diagnosis not present

## 2023-07-02 DIAGNOSIS — E669 Obesity, unspecified: Secondary | ICD-10-CM | POA: Diagnosis not present

## 2023-10-24 DIAGNOSIS — E119 Type 2 diabetes mellitus without complications: Secondary | ICD-10-CM | POA: Diagnosis not present

## 2023-10-24 DIAGNOSIS — E7849 Other hyperlipidemia: Secondary | ICD-10-CM | POA: Diagnosis not present

## 2023-10-24 DIAGNOSIS — K649 Unspecified hemorrhoids: Secondary | ICD-10-CM | POA: Diagnosis not present

## 2023-10-24 DIAGNOSIS — D509 Iron deficiency anemia, unspecified: Secondary | ICD-10-CM | POA: Diagnosis not present

## 2023-10-24 DIAGNOSIS — I10 Essential (primary) hypertension: Secondary | ICD-10-CM | POA: Diagnosis not present

## 2023-10-24 DIAGNOSIS — Z23 Encounter for immunization: Secondary | ICD-10-CM | POA: Diagnosis not present

## 2023-11-02 DIAGNOSIS — Z79891 Long term (current) use of opiate analgesic: Secondary | ICD-10-CM | POA: Diagnosis not present

## 2023-11-02 DIAGNOSIS — F1120 Opioid dependence, uncomplicated: Secondary | ICD-10-CM | POA: Diagnosis not present

## 2023-11-09 DIAGNOSIS — R809 Proteinuria, unspecified: Secondary | ICD-10-CM | POA: Diagnosis not present

## 2023-11-09 DIAGNOSIS — Z1212 Encounter for screening for malignant neoplasm of rectum: Secondary | ICD-10-CM | POA: Diagnosis not present

## 2023-11-09 DIAGNOSIS — Z1211 Encounter for screening for malignant neoplasm of colon: Secondary | ICD-10-CM | POA: Diagnosis not present

## 2023-11-09 DIAGNOSIS — E7849 Other hyperlipidemia: Secondary | ICD-10-CM | POA: Diagnosis not present

## 2023-11-09 DIAGNOSIS — E119 Type 2 diabetes mellitus without complications: Secondary | ICD-10-CM | POA: Diagnosis not present

## 2023-11-09 DIAGNOSIS — K649 Unspecified hemorrhoids: Secondary | ICD-10-CM | POA: Diagnosis not present

## 2023-11-09 DIAGNOSIS — F17210 Nicotine dependence, cigarettes, uncomplicated: Secondary | ICD-10-CM | POA: Diagnosis not present

## 2023-11-14 ENCOUNTER — Encounter: Payer: Self-pay | Admitting: *Deleted

## 2023-11-14 NOTE — Progress Notes (Signed)
 Whitney Hart                                          MRN: [id number]   11/14/2023   The [facility] Quality Team Specialist reviewed this patient medical record for the purposes of chart review for care gap closure. The following were reviewed: abstraction for care gap closure-glycemic status assessment and kidney health evaluation for diabetes:eGFR  and uACR.    [facility] Quality Team

## 2023-12-29 ENCOUNTER — Emergency Department

## 2023-12-29 ENCOUNTER — Emergency Department
Admission: EM | Admit: 2023-12-29 | Discharge: 2023-12-29 | Disposition: A | Discharge status: Home / Self Care | Source: Ambulatory Visit | Attending: Emergency Medicine | Admitting: Emergency Medicine

## 2023-12-29 DIAGNOSIS — R072 Precordial pain: Secondary | ICD-10-CM | POA: Diagnosis not present

## 2023-12-29 DIAGNOSIS — R079 Chest pain, unspecified: Secondary | ICD-10-CM | POA: Diagnosis not present

## 2023-12-29 DIAGNOSIS — Z720 Tobacco use: Secondary | ICD-10-CM | POA: Diagnosis not present

## 2023-12-29 DIAGNOSIS — I10 Essential (primary) hypertension: Secondary | ICD-10-CM | POA: Insufficient documentation

## 2023-12-29 DIAGNOSIS — R0789 Other chest pain: Secondary | ICD-10-CM | POA: Diagnosis not present

## 2023-12-29 DIAGNOSIS — Z789 Other specified health status: Secondary | ICD-10-CM | POA: Diagnosis not present

## 2023-12-29 DIAGNOSIS — E119 Type 2 diabetes mellitus without complications: Secondary | ICD-10-CM | POA: Diagnosis not present

## 2023-12-29 LAB — CBC
HCT: 42.9 % (ref 36.0–46.0)
Hemoglobin: 13.8 g/dL (ref 12.0–15.0)
MCH: 25.7 pg — ABNORMAL LOW (ref 26.0–34.0)
MCHC: 32.2 g/dL (ref 30.0–36.0)
MCV: 79.9 fL — ABNORMAL LOW (ref 80.0–100.0)
Platelets: 292 K/uL (ref 150–400)
RBC: 5.37 MIL/uL — ABNORMAL HIGH (ref 3.87–5.11)
RDW: 14.9 % (ref 11.5–15.5)
WBC: 7.9 K/uL (ref 4.0–10.5)
nRBC: 0 % (ref 0.0–0.2)

## 2023-12-29 LAB — BASIC METABOLIC PANEL WITH GFR
Anion gap: 12 (ref 5–15)
BUN: 7 mg/dL (ref 6–20)
CO2: 26 mmol/L (ref 22–32)
Calcium: 9.5 mg/dL (ref 8.9–10.3)
Chloride: 103 mmol/L (ref 98–111)
Creatinine, Ser: 0.57 mg/dL (ref 0.44–1.00)
GFR, Estimated: >60 mL/min (ref >60)
Glucose, Bld: 99 mg/dL (ref 70–99)
Potassium: 3.5 mmol/L (ref 3.5–5.1)
Sodium: 141 mmol/L (ref 135–145)

## 2023-12-29 LAB — TROPONIN T, HIGH SENSITIVITY: Troponin T High Sensitivity: <15 ng/L (ref 0–19)

## 2023-12-29 MED ORDER — PREDNISONE 20 MG PO TABS
40.0000 mg | ORAL_TABLET | Freq: Once | ORAL | Status: AC
  Administered 2023-12-29: 40 mg via ORAL
  Filled 2023-12-29: qty 2

## 2023-12-29 MED ORDER — IPRATROPIUM-ALBUTEROL 0.5-2.5 (3) MG/3ML IN SOLN
6.0000 mL | Freq: Once | RESPIRATORY_TRACT | Status: AC
  Administered 2023-12-29: 6 mL via RESPIRATORY_TRACT
  Filled 2023-12-29: qty 6

## 2023-12-29 MED ORDER — KETOROLAC TROMETHAMINE 30 MG/ML IJ SOLN
15.0000 mg | Freq: Once | INTRAMUSCULAR | Status: AC
  Administered 2023-12-29: 15 mg via INTRAVENOUS
  Filled 2023-12-29: qty 1

## 2023-12-29 MED ORDER — ALBUTEROL SULFATE HFA 108 (90 BASE) MCG/ACT IN AERS: 2.0000 | INHALATION_SPRAY | Freq: Four times a day (QID) | RESPIRATORY_TRACT | 2 refills | Status: AC | PRN

## 2023-12-29 MED ORDER — PREDNISONE 50 MG PO TABS: 50.0000 mg | ORAL_TABLET | Freq: Every day | ORAL | 0 refills | Status: AC

## 2023-12-29 MED ORDER — ONDANSETRON HCL 4 MG/2ML IJ SOLN
4.0000 mg | Freq: Once | INTRAMUSCULAR | Status: AC
  Administered 2023-12-29: 4 mg via INTRAVENOUS
  Filled 2023-12-29: qty 2

## 2023-12-29 NOTE — ED Provider Notes (Signed)
 [facility] Provider Note    Event Date/Time   First MD Initiated Contact with Patient 12/29/23 1204     (approximate)   History   Chest Pain   HPI  Whitney Hart is a 53 y.o. female who presents to the ED for evaluation of Chest Pain   Review of routine PCP visit from last month.  Obese patient with history of DM, hyperlipidemia, HTN, tobacco use and chronic pain on tramadol .  Patient presents with substernal chest discomfort for the past 2 days, cough and congestion without increased sputum production.   Physical Exam   Triage Vital Signs: ED Triage Vitals [12/29/23 1050]  Encounter Vitals Group     BP (!) 133/107     Girls Systolic BP Percentile      Girls Diastolic BP Percentile      Boys Systolic BP Percentile      Boys Diastolic BP Percentile      Pulse Rate 95     Resp 18     Temp 98 F (36.7 C)     Temp Source Oral     SpO2 96 %     Weight 227 lb (103 kg)     Height 5' 6 (1.676 m)     Head Circumference      Peak Flow      Pain Score 7     Pain Loc      Pain Education      Exclude from Growth Chart     Most recent vital signs: Vitals:   12/29/23 1330 12/29/23 1404  BP: 114/63 123/80  Pulse:  87  Resp:  18  Temp:  98.2 F (36.8 C)  SpO2:  98%    General: Awake, no distress.  CV:  Good peripheral perfusion.  Resp:  Normal effort.  Diffuse and scattered expiratory wheezes, good airflow throughout. Abd:  No distention.  MSK:  No deformity noted.  Reproducible chest discomfort without overlying skin changes Neuro:  No focal deficits appreciated. Other:     ED Results / Procedures / Treatments   Labs (all labs ordered are listed, but only abnormal results are displayed) Labs Reviewed  CBC - Abnormal; Notable for the following components:      Result Value   RBC 5.37 (*)    MCV 79.9 (*)    MCH 25.7 (*)    All other components within normal limits  BASIC METABOLIC PANEL WITH GFR  TROPONIN T, HIGH SENSITIVITY     EKG Sinus rhythm with a rate of 89 bpm.  Normal axis and intervals.  No acute or signs of acute ischemia.  RADIOLOGY CXR interpreted by me without evidence of acute cardiopulmonary pathology.  Official radiology report(s): DG Chest 2 View Result Date: 12/29/2023 EXAM: 2 VIEW(S) XRAY OF THE CHEST 12/29/2023 11:41:00 AM COMPARISON: 12/12/2017 CLINICAL HISTORY: chest pain FINDINGS: LUNGS AND PLEURA: No focal pulmonary opacity. No pleural effusion. No pneumothorax. HEART AND MEDIASTINUM: No acute abnormality of the cardiac and mediastinal silhouettes. BONES AND SOFT TISSUES: No acute osseous abnormality. IMPRESSION: 1. No acute cardiopulmonary process. Electronically signed by: Donnice Mania MD 12/29/2023 12:54 PM EST RP Workstation: [id number]    PROCEDURES and INTERVENTIONS:  .1-3 Lead EKG Interpretation  Performed by: Claudene Rover, MD Authorized by: Claudene Rover, MD     Interpretation: normal     ECG rate:  88   ECG rate assessment: normal     Rhythm: sinus rhythm     Ectopy: none  Conduction: normal     Medications  ketorolac  (TORADOL ) 30 MG/ML injection 15 mg (15 mg Intravenous Given 12/29/23 1300)  ondansetron  (ZOFRAN ) injection 4 mg (4 mg Intravenous Given 12/29/23 1259)  ipratropium-albuterol  (DUONEB) 0.5-2.5 (3) MG/3ML nebulizer solution 6 mL (6 mLs Nebulization Given 12/29/23 1300)  predniSONE  (DELTASONE ) tablet 40 mg (40 mg Oral Given 12/29/23 1300)     IMPRESSION / MDM / ASSESSMENT AND PLAN / ED COURSE  I reviewed the triage vital signs and the nursing notes.  Differential diagnosis includes, but is not limited to, ACS, PTX, PNA, muscle strain/spasm, PE, dissection, anxiety, pleural effusion  {Patient presents with symptoms of an acute illness or injury that is potentially life-threatening.  Patient presents with atypical chest discomfort likely factorial from MSK and mild wheezing, ultimately suitable for outpatient management.  Focal tenderness on exam and I  suspect much of her pain is MSK, resolved after 1 dose of Toradol .  Provide DuoNebs and start her on steroids due to her wheezing and congestion.  She has negative troponin, normal metabolic panel and CBC.  Benign abdomen.  Suitable for outpatient management.  Clinical Course as of 12/29/23 1535  Thu Dec 29, 2023  1356 Reassessed.  Feeling much better and pain is resolved.  Wheezing is resolved on auscultation.  She is eager to go home which I think is reasonable.  Ate [employer] without nausea or emesis. [DS]    Clinical Course User Index [DS] Claudene Rover, MD     FINAL CLINICAL IMPRESSION(S) / ED DIAGNOSES   Final diagnoses:  Other chest pain     Rx / DC Orders   ED Discharge Orders          Ordered    predniSONE  (DELTASONE ) 50 MG tablet  Daily        12/29/23 1357    albuterol  (VENTOLIN  HFA) 108 (90 Base) MCG/ACT inhaler  Every 6 hours PRN        12/29/23 1357             Note:  This document was prepared using Dragon voice recognition software and may include unintentional dictation errors.   Claudene Rover, MD 12/29/23 1537

## 2023-12-29 NOTE — ED Triage Notes (Signed)
 Pt presents to the ED via POV from home with 7/10 mid sternal chest pain that started early yesterday morning. Pt reports headache. Pt also reports nausea and vomiting that started today. Pt A&Ox4. VSS.

## 2024-06-06 ENCOUNTER — Ambulatory Visit: Admitting: Internal Medicine
